# Patient Record
Sex: Female | Born: 1990 | Race: Black or African American | Hispanic: No | Marital: Single | Smoking: Current every day smoker
Health system: Southern US, Community
[De-identification: ages and names within clinical notes are randomized; demographics above are authoritative.]

## PROBLEM LIST (undated history)

## (undated) DIAGNOSIS — F32A Depression, unspecified: Secondary | ICD-10-CM

## (undated) DIAGNOSIS — F329 Major depressive disorder, single episode, unspecified: Secondary | ICD-10-CM

## (undated) DIAGNOSIS — Z789 Other specified health status: Secondary | ICD-10-CM

## (undated) DIAGNOSIS — D649 Anemia, unspecified: Secondary | ICD-10-CM

## (undated) HISTORY — DX: Anemia, unspecified: D64.9

## (undated) HISTORY — PX: HERNIA REPAIR: SHX51

---
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
## 2010-10-07 LAB — URINALYSIS, ROUTINE W REFLEX MICROSCOPIC
Leukocytes, UA: NEGATIVE
Nitrite: NEGATIVE
Specific Gravity, Urine: 1.008 (ref 1.005–1.030)
pH: 6 (ref 5.0–8.0)

## 2010-10-07 LAB — HEPATIC FUNCTION PANEL
Albumin: 4 g/dL (ref 3.5–5.2)
Bilirubin, Direct: <0.1 mg/dL (ref 0.0–0.3)
Total Bilirubin: 0.6 mg/dL (ref 0.3–1.2)

## 2010-10-07 LAB — URINE MICROSCOPIC-ADD ON

## 2010-10-07 LAB — GC/CHLAMYDIA PROBE AMP, URINE
Chlamydia, Swab/Urine, PCR: POSITIVE — AB
GC Probe Amp, Urine: NEGATIVE

## 2010-10-07 LAB — BASIC METABOLIC PANEL
BUN: 5 mg/dL — ABNORMAL LOW (ref 6–23)
Creatinine, Ser: 0.71 mg/dL (ref 0.4–1.2)
GFR calc non Af Amer: >60 mL/min (ref >60)

## 2010-10-07 LAB — CBC
MCV: 80.7 fL (ref 78.0–100.0)
Platelets: 401 10*3/uL — ABNORMAL HIGH (ref 150–400)
RDW: 14.5 % (ref 11.5–15.5)
WBC: 7.3 10*3/uL (ref 4.0–10.5)

## 2010-10-07 LAB — DIFFERENTIAL
Basophils Absolute: 0 10*3/uL (ref 0.0–0.1)
Eosinophils Absolute: 0.1 10*3/uL (ref 0.0–0.7)
Lymphocytes Relative: 36 % (ref 12–46)
Lymphs Abs: 2.6 10*3/uL (ref 0.7–4.0)
Neutrophils Relative %: 54 % (ref 43–77)

## 2010-10-07 LAB — POCT PREGNANCY, URINE: Preg Test, Ur: NEGATIVE

## 2010-10-07 LAB — RPR: RPR Ser Ql: NONREACTIVE

## 2010-10-07 LAB — GAMMA GT: GGT: 20 U/L (ref 7–51)

## 2010-10-07 LAB — RAPID URINE DRUG SCREEN, HOSP PERFORMED
Opiates: NOT DETECTED
Tetrahydrocannabinol: NOT DETECTED

## 2010-10-07 LAB — ETHANOL: Alcohol, Ethyl (B): <5 mg/dL (ref 0–10)

## 2010-10-11 LAB — POCT PREGNANCY, URINE: Preg Test, Ur: NEGATIVE

## 2010-10-11 LAB — POCT URINALYSIS DIP (DEVICE)
Glucose, UA: NEGATIVE mg/dL
Specific Gravity, Urine: 1.02 (ref 1.005–1.030)
Urobilinogen, UA: 2 mg/dL — ABNORMAL HIGH (ref 0.0–1.0)

## 2011-01-10 ENCOUNTER — Encounter (HOSPITAL_COMMUNITY): Payer: Self-pay | Admitting: Anesthesiology

## 2011-01-10 ENCOUNTER — Encounter (HOSPITAL_COMMUNITY): Payer: Self-pay

## 2011-01-10 ENCOUNTER — Inpatient Hospital Stay (HOSPITAL_COMMUNITY): Payer: Medicaid Other | Admitting: Anesthesiology

## 2011-01-10 ENCOUNTER — Encounter (HOSPITAL_COMMUNITY)
Admission: AD | Disposition: A | Discharge status: Home / Self Care | Payer: Self-pay | Source: Ambulatory Visit | Attending: Obstetrics and Gynecology

## 2011-01-10 ENCOUNTER — Other Ambulatory Visit: Payer: Self-pay | Admitting: Obstetrics and Gynecology

## 2011-01-10 ENCOUNTER — Inpatient Hospital Stay (HOSPITAL_COMMUNITY)
Admission: AD | Admit: 2011-01-10 | Discharge: 2011-01-13 | DRG: 766 | Disposition: A | Discharge status: Home / Self Care | Payer: Medicaid Other | Source: Ambulatory Visit | Attending: Obstetrics and Gynecology | Admitting: Obstetrics and Gynecology

## 2011-01-10 HISTORY — DX: Depression, unspecified: F32.A

## 2011-01-10 HISTORY — DX: Other specified health status: Z78.9

## 2011-01-10 HISTORY — DX: Major depressive disorder, single episode, unspecified: F32.9

## 2011-01-10 LAB — CBC
Hemoglobin: 12.8 g/dL (ref 12.0–15.0)
MCH: 28.6 pg (ref 26.0–34.0)
MCHC: 33.7 g/dL (ref 30.0–36.0)
MCV: 84.8 fL (ref 78.0–100.0)

## 2011-01-10 LAB — RPR
RPR Ser Ql: NONREACTIVE
RPR: NONREACTIVE

## 2011-01-10 LAB — ABO/RH: RH Type: POSITIVE

## 2011-01-10 SURGERY — Surgical Case
Anesthesia: Monitor Anesthesia Care | Site: Abdomen | Wound class: Clean Contaminated

## 2011-01-10 MED ORDER — RHO D IMMUNE GLOBULIN 1500 UNIT/2ML IJ SOLN: 300.0000 ug | Freq: Once | INTRAMUSCULAR | Status: DC

## 2011-01-10 MED ORDER — ONDANSETRON HCL 4 MG/2ML IJ SOLN: 4.0000 mg | INTRAMUSCULAR | Status: DC | PRN

## 2011-01-10 MED ORDER — OXYTOCIN 20 UNITS IN LACTATED RINGERS INFUSION - SIMPLE
125.0000 mL/h | INTRAVENOUS | Status: AC
  Administered 2011-01-10: 125 mL/h via INTRAVENOUS

## 2011-01-10 MED ORDER — EPHEDRINE 5 MG/ML INJ
10.0000 mg | INTRAVENOUS | Status: DC | PRN
  Filled 2011-01-10: qty 2

## 2011-01-10 MED ORDER — FENTANYL CITRATE 0.05 MG/ML IJ SOLN
INTRAMUSCULAR | Status: AC
  Filled 2011-01-10: qty 2

## 2011-01-10 MED ORDER — KETOROLAC TROMETHAMINE 60 MG/2ML IM SOLN
60.0000 mg | Freq: Once | INTRAMUSCULAR | Status: AC | PRN
  Administered 2011-01-10: 60 mg via INTRAMUSCULAR
  Filled 2011-01-10: qty 2

## 2011-01-10 MED ORDER — LIDOCAINE HCL 1.5 % IJ SOLN
INTRAMUSCULAR | Status: DC | PRN
  Administered 2011-01-10: 10 mL via INTRADERMAL

## 2011-01-10 MED ORDER — CITRIC ACID-SODIUM CITRATE 334-500 MG/5ML PO SOLN
30.0000 mL | ORAL | Status: DC | PRN
  Administered 2011-01-10: 30 mL via ORAL
  Filled 2011-01-10: qty 30
  Filled 2011-01-10: qty 15

## 2011-01-10 MED ORDER — PHENYLEPHRINE 40 MCG/ML (10ML) SYRINGE FOR IV PUSH (FOR BLOOD PRESSURE SUPPORT)
PREFILLED_SYRINGE | INTRAVENOUS | Status: AC
  Filled 2011-01-10: qty 10

## 2011-01-10 MED ORDER — CEFAZOLIN SODIUM 1-5 GM-% IV SOLN
INTRAVENOUS | Status: DC | PRN
  Administered 2011-01-10: 1 g via INTRAVENOUS

## 2011-01-10 MED ORDER — TETANUS-DIPHTH-ACELL PERTUSSIS 5-2.5-18.5 LF-MCG/0.5 IM SUSP
0.5000 mL | Freq: Once | INTRAMUSCULAR | Status: AC
  Administered 2011-01-11: 0.5 mL via INTRAMUSCULAR
  Filled 2011-01-10: qty 0.5

## 2011-01-10 MED ORDER — PROMETHAZINE HCL 25 MG/ML IJ SOLN
6.2500 mg | INTRAMUSCULAR | Status: DC | PRN
Start: 2011-01-10 — End: 2011-01-10

## 2011-01-10 MED ORDER — FENTANYL CITRATE 0.05 MG/ML IJ SOLN
INTRAMUSCULAR | Status: DC | PRN
  Administered 2011-01-10: 100 ug via INTRAVENOUS

## 2011-01-10 MED ORDER — WITCH HAZEL-GLYCERIN EX PADS: MEDICATED_PAD | CUTANEOUS | Status: DC | PRN

## 2011-01-10 MED ORDER — PRENATAL PLUS 27-1 MG PO TABS
1.0000 | ORAL_TABLET | Freq: Every day | ORAL | Status: DC
  Administered 2011-01-11 – 2011-01-13 (×3): 1 via ORAL
  Filled 2011-01-10 (×3): qty 1

## 2011-01-10 MED ORDER — ACETAMINOPHEN 325 MG PO TABS: 650.0000 mg | ORAL_TABLET | ORAL | Status: DC | PRN

## 2011-01-10 MED ORDER — IBUPROFEN 600 MG PO TABS: 600.0000 mg | ORAL_TABLET | Freq: Four times a day (QID) | ORAL | Status: DC | PRN

## 2011-01-10 MED ORDER — ACETAMINOPHEN 10 MG/ML IV SOLN
1000.0000 mg | Freq: Once | INTRAVENOUS | Status: DC | PRN
  Filled 2011-01-10: qty 100

## 2011-01-10 MED ORDER — LACTATED RINGERS IV SOLN: 500.0000 mL | Freq: Once | INTRAVENOUS | Status: DC | PRN

## 2011-01-10 MED ORDER — SIMETHICONE 80 MG PO CHEW
80.0000 mg | CHEWABLE_TABLET | Freq: Three times a day (TID) | ORAL | Status: DC
  Administered 2011-01-11 – 2011-01-13 (×10): 80 mg via ORAL

## 2011-01-10 MED ORDER — SODIUM BICARBONATE 8.4 % IV SOLN
INTRAVENOUS | Status: AC
  Filled 2011-01-10: qty 50

## 2011-01-10 MED ORDER — MORPHINE SULFATE 0.5 MG/ML IJ SOLN
INTRAMUSCULAR | Status: AC
  Filled 2011-01-10: qty 10

## 2011-01-10 MED ORDER — PHENYLEPHRINE HCL 10 MG/ML IJ SOLN
INTRAMUSCULAR | Status: DC | PRN
  Administered 2011-01-10 (×2): 80 ug via INTRAVENOUS

## 2011-01-10 MED ORDER — FENTANYL 2.5 MCG/ML BUPIVACAINE 1/10 % EPIDURAL INFUSION (WH - ANES)
INTRAMUSCULAR | Status: DC | PRN
  Administered 2011-01-10: 14 mL/h via EPIDURAL

## 2011-01-10 MED ORDER — ONDANSETRON HCL 4 MG/2ML IJ SOLN
INTRAMUSCULAR | Status: AC
  Filled 2011-01-10: qty 2

## 2011-01-10 MED ORDER — LACTATED RINGERS IV SOLN
INTRAVENOUS | Status: DC | PRN
  Administered 2011-01-10: 19:00:00 via INTRAVENOUS

## 2011-01-10 MED ORDER — METOCLOPRAMIDE HCL 5 MG/ML IJ SOLN: 10.0000 mg | Freq: Three times a day (TID) | INTRAMUSCULAR | Status: DC | PRN

## 2011-01-10 MED ORDER — PHENYLEPHRINE 40 MCG/ML (10ML) SYRINGE FOR IV PUSH (FOR BLOOD PRESSURE SUPPORT)
80.0000 ug | PREFILLED_SYRINGE | INTRAVENOUS | Status: DC | PRN
  Filled 2011-01-10: qty 2

## 2011-01-10 MED ORDER — ONDANSETRON HCL 4 MG PO TABS: 4.0000 mg | ORAL_TABLET | ORAL | Status: DC | PRN

## 2011-01-10 MED ORDER — DIPHENHYDRAMINE HCL 50 MG/ML IJ SOLN
12.5000 mg | INTRAMUSCULAR | Status: DC | PRN
  Administered 2011-01-11 (×2): 50 mg via INTRAVENOUS
  Filled 2011-01-10: qty 1

## 2011-01-10 MED ORDER — OXYTOCIN 20 UNITS IN LACTATED RINGERS INFUSION - SIMPLE
INTRAVENOUS | Status: DC | PRN
  Administered 2011-01-10: 20 [IU] via INTRAVENOUS

## 2011-01-10 MED ORDER — IBUPROFEN 600 MG PO TABS
600.0000 mg | ORAL_TABLET | Freq: Four times a day (QID) | ORAL | Status: DC
  Administered 2011-01-11 – 2011-01-13 (×8): 600 mg via ORAL
  Filled 2011-01-10 (×8): qty 1

## 2011-01-10 MED ORDER — DIPHENHYDRAMINE HCL 50 MG/ML IJ SOLN
12.5000 mg | INTRAMUSCULAR | Status: DC | PRN
  Administered 2011-01-10: 12.5 mg via INTRAVENOUS
  Filled 2011-01-10: qty 1

## 2011-01-10 MED ORDER — CEFAZOLIN SODIUM 1-5 GM-% IV SOLN
INTRAVENOUS | Status: AC
  Filled 2011-01-10: qty 50

## 2011-01-10 MED ORDER — OXYCODONE-ACETAMINOPHEN 5-325 MG PO TABS
1.0000 | ORAL_TABLET | ORAL | Status: DC | PRN
  Administered 2011-01-11: 2 via ORAL
  Administered 2011-01-12: 1 via ORAL
  Administered 2011-01-12 – 2011-01-13 (×3): 2 via ORAL
  Filled 2011-01-10: qty 1
  Filled 2011-01-10 (×2): qty 2
  Filled 2011-01-10: qty 1
  Filled 2011-01-10: qty 2
  Filled 2011-01-10: qty 1

## 2011-01-10 MED ORDER — FLEET ENEMA 7-19 GM/118ML RE ENEM: 1.0000 | ENEMA | RECTAL | Status: DC | PRN

## 2011-01-10 MED ORDER — PHENYLEPHRINE 40 MCG/ML (10ML) SYRINGE FOR IV PUSH (FOR BLOOD PRESSURE SUPPORT)
80.0000 ug | PREFILLED_SYRINGE | INTRAVENOUS | Status: DC | PRN
  Filled 2011-01-10: qty 5
  Filled 2011-01-10: qty 2

## 2011-01-10 MED ORDER — LACTATED RINGERS IV SOLN
INTRAVENOUS | Status: DC
  Administered 2011-01-10 (×2): via INTRAVENOUS

## 2011-01-10 MED ORDER — ONDANSETRON HCL 4 MG/2ML IJ SOLN
INTRAMUSCULAR | Status: DC | PRN
  Administered 2011-01-10: 4 mg via INTRAVENOUS

## 2011-01-10 MED ORDER — NALOXONE HCL 0.4 MG/ML IJ SOLN: 0.4000 mg | INTRAMUSCULAR | Status: DC | PRN

## 2011-01-10 MED ORDER — FENTANYL 2.5 MCG/ML BUPIVACAINE 1/10 % EPIDURAL INFUSION (WH - ANES)
2.0000 mL/h | INTRAMUSCULAR | Status: DC
  Filled 2011-01-10 (×2): qty 60

## 2011-01-10 MED ORDER — SODIUM CHLORIDE 0.9 % IJ SOLN: 3.0000 mL | INTRAMUSCULAR | Status: DC | PRN

## 2011-01-10 MED ORDER — MENTHOL 3 MG MT LOZG: 1.0000 | LOZENGE | OROMUCOSAL | Status: DC | PRN

## 2011-01-10 MED ORDER — NALBUPHINE HCL 10 MG/ML IJ SOLN
5.0000 mg | INTRAMUSCULAR | Status: AC | PRN
  Administered 2011-01-10: 5 mg via INTRAVENOUS
  Filled 2011-01-10 (×2): qty 1

## 2011-01-10 MED ORDER — SENNOSIDES-DOCUSATE SODIUM 8.6-50 MG PO TABS
1.0000 | ORAL_TABLET | Freq: Every day | ORAL | Status: DC
  Administered 2011-01-10 – 2011-01-11 (×2): 1 via ORAL
  Administered 2011-01-12: 2 via ORAL
  Filled 2011-01-10 (×3): qty 2

## 2011-01-10 MED ORDER — LACTATED RINGERS IV SOLN
500.0000 mL | Freq: Once | INTRAVENOUS | Status: AC
  Administered 2011-01-10: 1000 mL via INTRAVENOUS

## 2011-01-10 MED ORDER — KETOROLAC TROMETHAMINE 30 MG/ML IJ SOLN: 30.0000 mg | Freq: Four times a day (QID) | INTRAMUSCULAR | Status: AC | PRN

## 2011-01-10 MED ORDER — SCOPOLAMINE 1 MG/3DAYS TD PT72
MEDICATED_PATCH | TRANSDERMAL | Status: AC
  Administered 2011-01-10: 1.5 mg via TRANSDERMAL
  Filled 2011-01-10: qty 1

## 2011-01-10 MED ORDER — ONDANSETRON HCL 4 MG/2ML IJ SOLN: 4.0000 mg | Freq: Three times a day (TID) | INTRAMUSCULAR | Status: DC | PRN

## 2011-01-10 MED ORDER — EPHEDRINE 5 MG/ML INJ
10.0000 mg | INTRAVENOUS | Status: DC | PRN
  Filled 2011-01-10: qty 4
  Filled 2011-01-10: qty 2

## 2011-01-10 MED ORDER — DIPHENHYDRAMINE HCL 25 MG PO CAPS
25.0000 mg | ORAL_CAPSULE | ORAL | Status: DC | PRN
  Administered 2011-01-11 – 2011-01-12 (×2): 25 mg via ORAL
  Filled 2011-01-10 (×3): qty 1

## 2011-01-10 MED ORDER — DIPHENHYDRAMINE HCL 50 MG/ML IJ SOLN: 25.0000 mg | INTRAMUSCULAR | Status: DC | PRN

## 2011-01-10 MED ORDER — LIDOCAINE-EPINEPHRINE (PF) 2 %-1:200000 IJ SOLN
INTRAMUSCULAR | Status: AC
  Filled 2011-01-10: qty 20

## 2011-01-10 MED ORDER — TERBUTALINE SULFATE 1 MG/ML IJ SOLN: 0.2500 mg | Freq: Once | INTRAMUSCULAR | Status: DC | PRN

## 2011-01-10 MED ORDER — MEPERIDINE HCL 25 MG/ML IJ SOLN: 6.2500 mg | INTRAMUSCULAR | Status: DC | PRN

## 2011-01-10 MED ORDER — OXYTOCIN 10 UNIT/ML IJ SOLN
INTRAMUSCULAR | Status: AC
  Filled 2011-01-10: qty 4

## 2011-01-10 MED ORDER — SODIUM CHLORIDE 0.9 % IV SOLN
1.0000 ug/kg/h | INTRAVENOUS | Status: DC | PRN
  Filled 2011-01-10: qty 2.5

## 2011-01-10 MED ORDER — ACETAMINOPHEN 325 MG PO TABS: 325.0000 mg | ORAL_TABLET | ORAL | Status: DC | PRN

## 2011-01-10 MED ORDER — MORPHINE SULFATE 10 MG/ML IJ SOLN
INTRAMUSCULAR | Status: DC | PRN
  Administered 2011-01-10: 1 mg via INTRAVENOUS

## 2011-01-10 MED ORDER — ZOLPIDEM TARTRATE 5 MG PO TABS: 5.0000 mg | ORAL_TABLET | Freq: Every evening | ORAL | Status: DC | PRN

## 2011-01-10 MED ORDER — SIMETHICONE 80 MG PO CHEW
80.0000 mg | CHEWABLE_TABLET | ORAL | Status: DC | PRN
  Administered 2011-01-10 – 2011-01-12 (×2): 80 mg via ORAL

## 2011-01-10 MED ORDER — SCOPOLAMINE 1 MG/3DAYS TD PT72
1.0000 | MEDICATED_PATCH | Freq: Once | TRANSDERMAL | Status: DC
  Administered 2011-01-10: 1.5 mg via TRANSDERMAL
  Filled 2011-01-10: qty 1

## 2011-01-10 MED ORDER — KETOROLAC TROMETHAMINE 30 MG/ML IJ SOLN
30.0000 mg | Freq: Four times a day (QID) | INTRAMUSCULAR | Status: AC | PRN
  Administered 2011-01-11: 30 mg via INTRAVENOUS
  Filled 2011-01-10: qty 1

## 2011-01-10 MED ORDER — ONDANSETRON HCL 4 MG/2ML IJ SOLN: 4.0000 mg | Freq: Four times a day (QID) | INTRAMUSCULAR | Status: DC | PRN

## 2011-01-10 MED ORDER — MORPHINE SULFATE (PF) 0.5 MG/ML IJ SOLN
INTRAMUSCULAR | Status: DC | PRN
  Administered 2011-01-10: 4 mg via EPIDURAL

## 2011-01-10 MED ORDER — KETOROLAC TROMETHAMINE 60 MG/2ML IM SOLN
INTRAMUSCULAR | Status: AC
  Administered 2011-01-10: 60 mg via INTRAMUSCULAR
  Filled 2011-01-10: qty 2

## 2011-01-10 MED ORDER — OXYTOCIN 20 UNITS IN LACTATED RINGERS INFUSION - SIMPLE
1.0000 m[IU]/min | INTRAVENOUS | Status: DC
  Administered 2011-01-10: 2 m[IU]/min via INTRAVENOUS

## 2011-01-10 MED ORDER — OXYTOCIN 20 UNITS IN LACTATED RINGERS INFUSION - SIMPLE
125.0000 mL/h | Freq: Once | INTRAVENOUS | Status: DC
  Filled 2011-01-10: qty 1000

## 2011-01-10 MED ORDER — NALBUPHINE HCL 10 MG/ML IJ SOLN
5.0000 mg | INTRAMUSCULAR | Status: AC | PRN
  Administered 2011-01-11: 5 mg via SUBCUTANEOUS
  Filled 2011-01-10: qty 1

## 2011-01-10 MED ORDER — DIPHENHYDRAMINE HCL 25 MG PO CAPS
25.0000 mg | ORAL_CAPSULE | Freq: Four times a day (QID) | ORAL | Status: DC | PRN
  Administered 2011-01-11 (×2): 25 mg via ORAL

## 2011-01-10 MED ORDER — LIDOCAINE-EPINEPHRINE (PF) 1.5 %-1:200000 IJ SOLN
INTRAMUSCULAR | Status: DC | PRN
  Administered 2011-01-10: 15 mL

## 2011-01-10 MED ORDER — HYDROMORPHONE HCL 1 MG/ML IJ SOLN: 0.2500 mg | INTRAMUSCULAR | Status: DC | PRN

## 2011-01-10 MED ORDER — LIDOCAINE HCL (PF) 1 % IJ SOLN
30.0000 mL | Freq: Once | INTRAMUSCULAR | Status: DC | PRN
  Filled 2011-01-10: qty 30

## 2011-01-10 MED ORDER — NALBUPHINE SYRINGE 5 MG/0.5 ML
10.0000 mg | INJECTION | INTRAMUSCULAR | Status: DC | PRN
  Filled 2011-01-10: qty 1

## 2011-01-10 SURGICAL SUPPLY — 23 items
CLOTH BEACON ORANGE TIMEOUT ST (SAFETY) ×2 IMPLANT
ELECT REM PT RETURN 9FT ADLT (ELECTROSURGICAL) ×2
ELECTRODE REM PT RTRN 9FT ADLT (ELECTROSURGICAL) ×1 IMPLANT
GLOVE BIO SURGEON STRL SZ 6.5 (GLOVE) ×2 IMPLANT
GLOVE BIO SURGEON STRL SZ7 (GLOVE) ×2 IMPLANT
GLOVE BIOGEL PI IND STRL 7.0 (GLOVE) ×1 IMPLANT
GLOVE BIOGEL PI INDICATOR 7.0 (GLOVE) ×1
GLOVE ECLIPSE 7.0 STRL STRAW (GLOVE) ×2 IMPLANT
GLOVE INDICATOR 7.0 STRL GRN (GLOVE) ×2 IMPLANT
GOWN BRE IMP SLV AUR LG STRL (GOWN DISPOSABLE) ×6 IMPLANT
KIT ABG SYR 3ML LUER SLIP (SYRINGE) ×2 IMPLANT
NEEDLE HYPO 25X5/8 SAFETYGLIDE (NEEDLE) ×2 IMPLANT
NS IRRIG 1000ML POUR BTL (IV SOLUTION) ×2 IMPLANT
PACK C SECTION WH (CUSTOM PROCEDURE TRAY) ×2 IMPLANT
RTRCTR C-SECT PINK 25CM LRG (MISCELLANEOUS) ×2 IMPLANT
SLEEVE SCD COMPRESS KNEE MED (MISCELLANEOUS) ×2 IMPLANT
STAPLER VISISTAT 35W (STAPLE) ×2 IMPLANT
SUT CHROMIC 0 CTX 36 (SUTURE) ×4 IMPLANT
SUT VIC AB 0 CTXB 36 (SUTURE) ×4 IMPLANT
SUT VIC AB 2-0 CT1 27 (SUTURE) ×1
SUT VIC AB 2-0 CT1 TAPERPNT 27 (SUTURE) ×1 IMPLANT
TOWEL OR 17X24 6PK STRL BLUE (TOWEL DISPOSABLE) ×4 IMPLANT
WATER STERILE IRR 1000ML POUR (IV SOLUTION) ×2 IMPLANT

## 2011-01-10 NOTE — Consult Note (Signed)
Neonatology Note:   I was asked to attend this Stat C/S at term. The mother is a G1P0 AB pos, GBS neg with. She was having FHR decels for about 1 hour PTD, which worsened upon ROM. Meconium-stained fluid was noted and a Stat c/s was called. At delivery, infant vigorous with good spontaneous cry and tone. Needed only minimal bulb suctioning. Ap 9/9. Lungs clear to ausc in DR. To CN at 1932 to care of Pediatrician. Mellody Memos, MD

## 2011-01-10 NOTE — Progress Notes (Signed)
Pt crying and upset, having anxiety about getting epidural

## 2011-01-10 NOTE — ED Notes (Signed)
Pt with cervical change, GBS negative, FHT reactive and reassuring, order to admit to [facility]. Routine orders, Pt may have epidural when needed

## 2011-01-10 NOTE — Anesthesia Procedure Notes (Addendum)
Epidural Patient location during procedure: OB Start time: 01/10/2011 1:50 PM Reason for block: procedure for pain  Staffing Performed by: anesthesiologist   Preanesthetic Checklist Completed: patient identified, site marked, surgical consent, pre-op evaluation, timeout performed, IV checked, risks and benefits discussed and monitors and equipment checked  Epidural Patient position: sitting Prep: DuraPrep Patient monitoring: continuous pulse ox Approach: midline Injection technique: LOR saline  Needle Needle type: Tuohy  Needle gauge: 17 G Needle length: 9 cm Catheter type: closed end flexible Catheter size: 19 Gauge Test dose: negative  Assessment Events: blood not aspirated, injection not painful, no injection resistance, negative IV test and no paresthesia  Additional Notes Patient identified.  Risk benefits discussed including failed block, incomplete pain control, headache, nerve damage, paralysis, blood pressure changes, reactions to medication both toxic or allergic, and postpartum back pain.  Patient expressed understanding and wished to proceed.  All questions were answered.  Sterile technique used throughout procedure and epidural site dressed with sterile barrier dressing.  Please see nursing notes for vital signs.   Performed by: Jiles Garter

## 2011-01-10 NOTE — Transfer of Care (Signed)
  Anesthesia Post-op Note  Patient: Brittany Pierce  Procedure(s) Performed:  CESAREAN SECTION  No anesthesia complications.  Level of consciousness: alert. Cardiopulmonary status stable.  No follow-up care or observation required.  Monick Rena L. Rodman Pickle, MD

## 2011-01-10 NOTE — Anesthesia Postprocedure Evaluation (Signed)
Immediate Anesthesia Transfer of Care Note  Patient: Brittany Pierce  Procedure(s) Performed:  CESAREAN SECTION  Patient Location: PACU  Anesthesia Type: Epidural  Level of Consciousness: awake, oriented and patient cooperative  Airway & Oxygen Therapy: Patient Spontanous Breathing  Post-op Assessment: Report given to PACU RN and Post -op Vital signs reviewed and stable  Post vital signs: stable  Complications: No apparent anesthesia complications

## 2011-01-10 NOTE — Progress Notes (Signed)
Contractions since this morning, no bleeding, no LOF

## 2011-01-10 NOTE — Anesthesia Preprocedure Evaluation (Signed)
Anesthesia Evaluation  Name, MR# and DOB Patient awake  General Assessment Comment  Reviewed: Allergy & Precautions, H&P  and Patient's Chart, lab work & pertinent test results  History of Anesthesia Complications Negative for: history of anesthetic complications  Airway Mallampati: IV TM Distance: >3 FB Neck ROM: full    Dental No notable dental hx    Pulmonaryneg pulmonary ROS      pulmonary exam normal obese  Cardiovascular Exercise Tolerance: Good regular Normal   Neuro/PsychNegative Neurological ROS Negative Psych ROS  GI/Hepatic/Renal negative GI ROS, negative Liver ROS, and negative Renal ROS (+)       Endo/Other  Negative Endocrine ROS (+)   Abdominal   Musculoskeletal  Hematology negative hematology ROS (+)   Peds  Reproductive/Obstetrics negative OB ROS          Anesthesia Physical Anesthesia Plan  ASA: II  Anesthesia Plan: Epidural   Post-op Pain Management:    Induction:   Airway Management Planned:   Additional Equipment:   Intra-op Plan:   Post-operative Plan:   Informed Consent: I have reviewed the patients History and Physical, chart, labs and discussed the procedure including the risks, benefits and alternatives for the proposed anesthesia with the patient or authorized representative who has indicated his/her understanding and acceptance.     Plan Discussed with:   Anesthesia Plan Comments:         Anesthesia Quick Evaluation

## 2011-01-10 NOTE — Transfer of Care (Signed)
  Anesthesia Post-op Note  Patient: Brittany Pierce  Procedure(s) Performed:  CESAREAN SECTION  Patient Location: PACU  Anesthesia Type: Epidural  Level of Consciousness: awake, alert , oriented and patient cooperative  Airway and Oxygen Therapy: Patient Spontanous Breathing  Post-op Pain: none  Post-op Assessment: Post-op Vital signs reviewed, Patient's Cardiovascular Status Stable and Respiratory Function Stable  Post-op Vital Signs: stable  Complications: No apparent anesthesia complications

## 2011-01-10 NOTE — Treatment Plan (Signed)
Pt with varible decelerations. Spoke with dr Gevena Cotton. Pt to be monitored for 1-2 hours, recheck cervix and d/c if no change and reactive FHT

## 2011-01-10 NOTE — Progress Notes (Signed)
Brittany Pierce is a 20 y.o. G1P1001 at [redacted]w[redacted]d by LMP admitted for active labor  Subjective:   Objective: BP 109/56  Pulse 75  Temp(Src) 98.2 F (36.8 C) (Oral)  Resp 18  Ht 5\' 4"  (1.626 m)  Wt 82.645 kg (182 lb 3.2 oz)  BMI 31.27 kg/m2  SpO2 100%  Breastfeeding? Unknown   I/O this shift: In: 800 [I.V.:800] Out: 2100 [Urine:1400; Blood:700]  FHT:  FHR: 120 bpm, variability: moderate,  accelerations:  Abscent,  decelerations:  Present with repetitive variable decelerations UC:   irregular, every 3-6 with couplets minutes SVE:   Dilation: 8 Effacement (%): 100 Station: -1 Exam by:: [name]  Labs: Lab Results  Component Value Date   WBC 9.1 01/10/2011   HGB 12.8 01/10/2011   HCT 38.0 01/10/2011   MCV 84.8 01/10/2011   PLT 219 01/10/2011    Assessment / Plan: Nonreassuring fetal heart tracing Artificial rupture of membranes done with moderate meconium noted. Fetal scalp electrode and IUPC placed for internal monitoring.  Attempted amnioinfusion to resolve repetitive variable decelerations. Change in position, IV fluids, stopping the pitocin did not help. Kept patient and her partner updated throughout the process. Offered option of cesarean section explaining  Indication, risks, benefits. She gave her informed consent and we proceeded to do a cesarean section.  Brittany Pierce 01/10/2011, 7:54 PM

## 2011-01-10 NOTE — Op Note (Signed)
Pre-operative diagnosis: nonreassuring fetal heart tracing  Postoperative diagnosis: same  Procedure performed: primary low transverse cesarean section  Findings: Full term live female child delivered at 1921 Apgars were 9 and 9. Cord pH 7.29.  Urine output 1400 mL clear yellow urine.  Estimated blood loss 700 mL.  IV fluids 400 mL  The patient is a 20 year old gravida 1 para 0 at [redacted] weeks gestation who was admitted in labor she was initially 2 cm dilated and 60% a phase -2 station. She progressed to 4 cm dilation and her contractions started to space out. She was then started on Pitocin for augmentation. The fetal heart tracing initially was stable then she developed variable decelerations the Pitocin was discontinued we tried several maneuvers including oxygen, IV fluid boluses, change in position, and amnioinfusion. Her artificial rupture of membranes revealed moderate meconium. Her heart decelerations persisted and became more frequent therefore a diagnosis of nonreassuring fetal heart tracing was made. informed consent was obtained from the patient she delivered a baby via cesarean section.her epidural which had been in place during the labor process was bolused accordingly she was taken to the operating room stat. She was prepped and draped in sterile manner. A Pfannenstiel incision was made 2 cm above the pubic symphysis with a scalpel. The incision was carried down to the fascia with the scalpel and Mayo scissors was used to incise the fascia bilaterally in a curvilinear fashion. 2 kocher clamps were used to grasp the superior fascial flap on either side of the midline. The fascia was tented upwards and the underlying rectus muscle was sharply dissected off with the Mayo scissors.the same was done in inferiorly down to the pubic bone. The rectus muscle was then separated in the midline and the peritoneum was entered by blunt dissection a bladder flap was created with the Metzenbaum scissors and  developed digitally. A ALexis O. Retractor was placed. The uterus was incised with the scalpel. The infant's head was delivered atraumatically and the rest of the body with fundal pressure there. There was a nuchal cord that was reduced upon delivery. The infant's nose and mouth were suctioned. The cord was clamped and cut. The baby was handed off to the waiting pediatricians. Cord blood was obtained as well as a sample for cord gas. The baby cried spontaneously on the operative field even before he was handed off to the pediatricians. The uterus was then massaged as the placenta was delivered. The uterus was cleared of all clots and debris using a dry laparotomy sponge. The bleeding edges of the uterus were grasped with clamps and a single layer uterine closure was done using 0 chromic in a running locked fashion. The closure was found to be hemostatic. The field was inspected and we had excellent hemostasis and then we began our closure. The fascia was closed in a running continuous fashion with 0 Vicryl and the skin was closed with staples. The sponge, needle, and instrument counts were correct x2 the patient tolerated the procedure well. She was transferred to the PACU in stable condition.

## 2011-01-10 NOTE — Progress Notes (Deleted)
Applied and assessing

## 2011-01-10 NOTE — H&P (Signed)
Brittany Pierce is a 20 y.o. female presenting for painful contractions. She was found to be 1cm dilated in the office on Friday, then progressed to 2cm with regular contractions after a period of observation in the MAU. Maternal Medical History:  Reason for admission: Reason for admission: contractions.  Contractions: Onset was 3-5 hours ago.   Frequency: irregular.   Perceived severity is moderate.    Fetal activity: Perceived fetal activity is normal.      OB History    Grav Para Term Preterm Abortions TAB SAB Ect Mult Living   1 1 1  0 0 0 0 0 0 1     Past Medical History  Diagnosis Date  . No pertinent past medical history   . Depression     per prenatal   Past Surgical History  Procedure Date  . Hernia repair    Family History: family history is negative for Anesthesia problems, and Hypotension, and Malignant hyperthermia, and Pseudochol deficiency, . Social History:  reports that she quit smoking about 7 months ago. Her smoking use included Cigarettes. She has never used smokeless tobacco. She reports that she does not drink alcohol or use illicit drugs.  Review of Systems  Constitutional: Negative.   HENT: Negative.   Eyes: Negative.   Respiratory: Negative.   Cardiovascular: Negative.   Gastrointestinal: Negative.   Genitourinary: Negative.   Musculoskeletal: Negative.   Skin: Negative.   Neurological: Negative.   Endo/Heme/Allergies: Negative.   Psychiatric/Behavioral: Negative.     Dilation: 8 Effacement (%): 100 Station: -1 Exam by:: Tasheka Houseman Blood pressure 109/56, pulse 75, temperature 98.2 F (36.8 C), temperature source Oral, resp. rate 18, height 5\' 4"  (1.626 m), weight 82.645 kg (182 lb 3.2 oz), SpO2 100.00%, unknown if currently breastfeeding. Maternal Exam:  Uterine Assessment: Contraction strength is moderate.  Abdomen: Patient reports no abdominal tenderness. Introitus: Normal vulva.   Physical Exam  Constitutional: She is oriented to person,  place, and time. She appears well-developed.  HENT:  Head: Normocephalic.  Neck: Normal range of motion.  Cardiovascular: Normal rate.   Respiratory: Effort normal and breath sounds normal.  GI: Soft.       Palpable contractions  Musculoskeletal: Normal range of motion.  Neurological: She is alert and oriented to person, place, and time.  Skin: Skin is warm.  Psychiatric: She has a normal mood and affect.    Prenatal labs:       RPR: NON REACTIVE (07/08 1128)  HBsAg:    HIV: Non-reactive (07/08 0854)  GBS: Negative (07/08 0853)   Assessment/Plan: 40 week IUP in active labor. FHT stable. Augmentation with pitocin per protocol Epidural for pain management.  Gorge Almanza 01/10/2011, 7:50 PM

## 2011-01-11 NOTE — Progress Notes (Signed)
UR chart review completed. Mical Brun RNC BSN 

## 2011-01-11 NOTE — Progress Notes (Signed)
Suggested by RN to enter room to assist with latch.  However, baby sleeping deeply (suck exam done: baby did not want to suck.  Tongue noted to lateralize well).  Mom taught hand expression and given phone # to call [facility] for later assist. [facility].

## 2011-01-11 NOTE — Progress Notes (Signed)
Repeated attempts to latch, infant and mom very sleepy, observed 12-15 minutes, mom describes good strong tugs at breast without pain. Wide flanged mouth with intermittent swallowing observed.

## 2011-01-12 NOTE — Progress Notes (Signed)
Baby sucks on tongue, suck training reviewed with mom. Mom was able to latch baby on right breast without assist. Advised mom baby needs to breastfeed every 2-3 hours or on demand for 10-20 minutes each breast. Awakening techniques reviewed.

## 2011-01-12 NOTE — Progress Notes (Signed)
Subjective: Postpartum Day 1: Cesarean Delivery Patient reports tolerating PO.    Objective: Vital signs in last 24 hours: Temp:  [97.7 F (36.5 C)-98.3 F (36.8 C)] 97.7 F (36.5 C) (07/10 0635) Pulse Rate:  [76-109] 76  (07/10 0635) Resp:  [18-20] 18  (07/10 0635) BP: (104-147)/(68-77) 110/69 mmHg (07/10 0635) SpO2:  [98 %-99 %] 99 % (07/09 1840)  Physical Exam:  General: alert and no distress Lochia: appropriate Uterine Fundus: firm Incision: healing well DVT Evaluation: No evidence of DVT seen on physical exam.   Basename 01/10/11 1128  HGB 12.8  HCT 38.0    Assessment/Plan: Status post Cesarean section. Doing well postoperatively.  Continue current care.  Randall Colden E 01/12/2011, 11:16 AM

## 2011-01-12 NOTE — Progress Notes (Signed)
Baby not nursing every 3 hours, according to chart baby did not nurse for greater that 6 hours last night, nursed at 0515 and had not nursed again till 1245. Currently at 5% weight loss, Advised mom importance of breastfeeding every 3 hours, mom has been offering breast, baby sleepy. Awakening techniques reviewed. Will have RN set up DEBP to encourage milk production and use any EBM available as supplement for baby.

## 2011-01-13 NOTE — Progress Notes (Signed)
ASSISTED WITH LATCH BUT BABY SLIPPING OFF NIPPLE.  #20MM NIPPLE SHIELD USED WITH IMPROVEMENT SUSTAINING LATCH.  GOOD RHYTHMIC SUCK-SWALLOW OBSERVED.  INSTRUCTED MOM TO PUMP X 15 MIN. AFTER FEED AND PC WITH 15-30 MLS. EBM.  PATIENTS BREASTS FULL, INSTRUCTED TO PUT BRA AND SHELLS ON.

## 2011-01-13 NOTE — Progress Notes (Signed)
BABY IS NOT LATCHING WELL.  ATTEMPTED ASSIST THIS AM AT 1100 BUT BABY TOO SLEEPY.  BABY SLIGHTLY JAUNDICED.  MOM PUMPED 23 CC'S AND ASSISTED HER WITH BOTTLEFEEDING BABY.  LC TO FOLLOW UP AT NEXT FEEDING.

## 2011-01-13 NOTE — Progress Notes (Signed)
Post Partum Day2 Subjective: no complaints  Objective: Blood pressure 108/70, pulse 82, temperature 98.3 F (36.8 C), temperature source Oral, resp. rate 20, height 5\' 4"  (1.626 m), weight 82.645 kg (182 lb 3.2 oz), SpO2 99.00%, unknown if currently breastfeeding.  Physical Exam:  General: alert and no distress Lochia: appropriate Uterine Fundus: firm Incision: healing well DVT Evaluation: No evidence of DVT seen on physical exam.  No results found for this basename: HGB:2,HCT:2 in the last 72 hours  Assessment/Plan: Plan for discharge tomorrow   LOS: 3 days   GREENE,ELEANOR E 01/13/2011, 2:06 PM

## 2011-01-13 NOTE — Progress Notes (Signed)
Post Partum Day3 Subjective: no complaints  Objective: Blood pressure 108/70, pulse 82, temperature 98.3 F (36.8 C), temperature source Oral, resp. rate 20, height 5\' 4"  (1.626 m), weight 82.645 kg (182 lb 3.2 oz), SpO2 99.00%, unknown if currently breastfeeding.  Physical Exam:  General: alert and no distress Lochia: appropriate Uterine Fundus: firm Incision: healing well DVT Evaluation: No evidence of DVT seen on physical exam.  No results found for this basename: HGB:2,HCT:2 in the last 72 hours  Assessment/Plan: Discharge home   LOS: 3 days   Brittany Pierce E 01/13/2011, 2:08 PM   3

## 2011-01-13 NOTE — Progress Notes (Signed)
Post Partum Day3 Subjective: no complaints  Objective: Blood pressure 127/78, pulse 92, temperature 97.9 F (36.6 C), temperature source Oral, resp. rate 19, height 5\' 4"  (1.626 m), weight 82.645 kg (182 lb 3.2 oz), SpO2 99.00%, unknown if currently breastfeeding.  Physical Exam:  General: alert and no distress Lochia: appropriate Uterine Fundus: firm Incision: healing well DVT Evaluation: No evidence of DVT seen on physical exam.  No results found for this basename: HGB:2,HCT:2 in the last 72 hours  Assessment/Plan: Discharge home   LOS: 3 days   GREENE,ELEANOR E 01/13/2011, 2:36 PM

## 2011-01-13 NOTE — Progress Notes (Signed)
LC ATTEMPTED TO ASSIST WITH 3 HOUR FEED BUT MOM HAD JUST PUMPED AND BOTTLEFED 32 CC OF EBM.  INSTRUCTED HER TO CALL FOR ASSIST WHEN BABY DESIRES TO EAT NEXT.

## 2011-01-15 NOTE — Discharge Summary (Signed)
Obstetric Discharge Summary Reason for Admission: onset of labor Prenatal Procedures: none Intrapartum Procedures: spontaneous vaginal delivery Postpartum Procedures: none Complications-Operative and Postpartum: none  Hemoglobin  Date Value Range Status  01/10/2011 12.8  12.0-15.0 (g/dL) Final     HCT  Date Value Range Status  01/10/2011 38.0  36.0-46.0 (%) Final    Discharge Diagnoses: Term Pregnancy-delivered  Discharge Information: Date: 01/15/2011 Activity: unrestricted Diet: routine  Condition: stable Instructions: refer to practice specific booklet Discharge to: home   Newborn Data: Live born  Information for the patient's newborn:  Lynelle Doctor [[id number]]  female ; APGAR , ; weight ;  Home with mother.  Brittany Pierce 01/15/2011, 12:09 PM

## 2011-02-23 ENCOUNTER — Other Ambulatory Visit: Payer: Self-pay | Admitting: Obstetrics and Gynecology

## 2011-02-23 ENCOUNTER — Other Ambulatory Visit (HOSPITAL_COMMUNITY)
Admission: RE | Admit: 2011-02-23 | Discharge: 2011-02-23 | Disposition: A | Discharge status: Home / Self Care | Payer: Medicaid Other | Source: Ambulatory Visit | Attending: Obstetrics and Gynecology | Admitting: Obstetrics and Gynecology

## 2011-02-23 DIAGNOSIS — Z01419 Encounter for gynecological examination (general) (routine) without abnormal findings: Secondary | ICD-10-CM | POA: Insufficient documentation

## 2014-05-06 ENCOUNTER — Encounter (HOSPITAL_COMMUNITY): Payer: Self-pay

## 2014-07-18 ENCOUNTER — Encounter (HOSPITAL_COMMUNITY): Payer: Self-pay | Admitting: Obstetrics and Gynecology

## 2014-12-12 ENCOUNTER — Encounter (HOSPITAL_COMMUNITY): Payer: Self-pay | Admitting: Obstetrics and Gynecology

## 2014-12-13 ENCOUNTER — Emergency Department
Admission: EM | Admit: 2014-12-13 | Discharge: 2014-12-13 | Disposition: A | Discharge status: Home / Self Care | Payer: Medicaid Other | Attending: Emergency Medicine | Admitting: Emergency Medicine

## 2014-12-13 ENCOUNTER — Emergency Department: Payer: Medicaid Other

## 2014-12-13 DIAGNOSIS — Y9389 Activity, other specified: Secondary | ICD-10-CM | POA: Diagnosis not present

## 2014-12-13 DIAGNOSIS — S3992XA Unspecified injury of lower back, initial encounter: Secondary | ICD-10-CM | POA: Diagnosis present

## 2014-12-13 DIAGNOSIS — Y9289 Other specified places as the place of occurrence of the external cause: Secondary | ICD-10-CM | POA: Diagnosis not present

## 2014-12-13 DIAGNOSIS — Z79899 Other long term (current) drug therapy: Secondary | ICD-10-CM | POA: Insufficient documentation

## 2014-12-13 DIAGNOSIS — Z87891 Personal history of nicotine dependence: Secondary | ICD-10-CM | POA: Diagnosis not present

## 2014-12-13 DIAGNOSIS — W1839XA Other fall on same level, initial encounter: Secondary | ICD-10-CM | POA: Insufficient documentation

## 2014-12-13 DIAGNOSIS — M461 Sacroiliitis, not elsewhere classified: Secondary | ICD-10-CM

## 2014-12-13 DIAGNOSIS — Y998 Other external cause status: Secondary | ICD-10-CM | POA: Diagnosis not present

## 2014-12-13 MED ORDER — IBUPROFEN 800 MG PO TABS: 800.0000 mg | ORAL_TABLET | Freq: Three times a day (TID) | ORAL | Status: DC | PRN

## 2014-12-13 NOTE — ED Notes (Signed)
Pt states that her left lower leg started hurting on Sunday and the right thigh started hurting last night. There was no significant event that happened that lead to the pain.  Both sites appear to be normal with no swelling . Pt able to bear weight and walk.ROM appears normal. Pt appears in no distress , airway seems intact and the color is within normal limits.

## 2014-12-13 NOTE — ED Provider Notes (Signed)
[facility] Emergency Department Provider Note  ____________________________________________  Time seen: Approximately 12:50 PM  I have reviewed the triage vital signs and the nursing notes.   HISTORY  Chief Complaint Leg Pain    HPI Brittany Pierce is a 24 y.o. female patient presents with multiple complaints.*Soft with left lower leg pain started to hurt 5 nights ago, than right thigh started to hurt 3 nights ago. Now states that she fell about 3 weeks ago and landed on her tailbone is complaining of tailbone pain.Patient complains of "10 over 10 pain" nonradiating   Past Medical History  Diagnosis Date  . No pertinent past medical history   . Depression     per prenatal    There are no active problems to display for this patient.   Past Surgical History  Procedure Laterality Date  . Hernia repair      Current Outpatient Rx  Name  Route  Sig  Dispense  Refill  . ibuprofen (ADVIL,MOTRIN) 800 MG tablet   Oral   Take 1 tablet (800 mg total) by mouth every 8 (eight) hours as needed.   30 tablet   0   . prenatal vitamin w/FE, FA (PRENATAL 1 + 1) 27-1 MG TABS   Oral   Take 1 tablet by mouth daily.             Allergies Review of patient's allergies indicates no known allergies.  Family History  Problem Relation Age of Onset  . Anesthesia problems Neg Hx   . Hypotension Neg Hx   . Malignant hyperthermia Neg Hx   . Pseudochol deficiency Neg Hx     Social History History  Substance Use Topics  . Smoking status: Former Smoker    Types: Cigarettes    Quit date: 06/11/2010  . Smokeless tobacco: Never Used  . Alcohol Use: No    Review of Systems Constitutional: No fever/chills Eyes: No visual changes. ENT: No sore throat. Cardiovascular: Denies chest pain. Respiratory: Denies shortness of breath. Gastrointestinal: No abdominal pain.  No nausea, no vomiting.  No diarrhea.  No constipation. Genitourinary: Negative for  dysuria. Musculoskeletal: Negative for back pain. Skin: Negative for rash. Neurological: Negative for headaches, focal weakness or numbness.  10-point ROS otherwise negative.  ____________________________________________   PHYSICAL EXAM:  VITAL SIGNS: ED Triage Vitals  Enc Vitals Group     BP 12/13/14 1151 129/74 mmHg     Pulse Rate 12/13/14 1151 96     Resp 12/13/14 1151 15     Temp 12/13/14 1151 98.1 F (36.7 C)     Temp src --      SpO2 12/13/14 1151 96 %     Weight 12/13/14 1151 171 lb (77.565 kg)     Height 12/13/14 1151 5\' 4"  (1.626 m)     Head Cir --      Peak Flow --      Pain Score 12/13/14 1205 10     Pain Loc --      Pain Edu? --      Excl. in GC? --     Constitutional: Alert and oriented. Well appearing and in no acute distress. Eyes: Conjunctivae are normal. PERRL. EOMI. Head: Atraumatic. Nose: No congestion/rhinnorhea. Mouth/Throat: Mucous membranes are moist.  Oropharynx non-erythematous. Neck: No stridor.   Cardiovascular: Normal rate, regular rhythm. Grossly normal heart sounds.  Good peripheral circulation. Respiratory: Normal respiratory effort.  No retractions. Lungs CTAB. Musculoskeletal: No lower extremity tenderness nor edema.  No joint  effusions. No evidence of ecchymosis edema or bruising noted. Ambulance without difficulty. Negative Homans Neurologic:  Normal speech and language. No gross focal neurologic deficits are appreciated. Speech is normal. No gait instability. Skin:  Skin is warm, dry and intact. No rash noted. Psychiatric: Mood and affect are normal. Speech and behavior are normal.  ____________________________________________   LABS (all labs ordered are listed, but only abnormal results are displayed)  Labs Reviewed - No data to display ____________________________________________  EKG  Deferred ____________________________________________  RADIOLOGY Interpreted by radiologist, reviewed by myself.  Negative.  ____________________________________________   PROCEDURES  Procedure(s) performed: None  Critical Care performed: No  ____________________________________________   INITIAL IMPRESSION / ASSESSMENT AND PLAN / ED COURSE  Pertinent labs & imaging results that were available during my care of the patient were reviewed by me and considered in my medical decision making (see chart for details).  Coccyx contusion and nonspecific contusion to right leg ____________________________________________   FINAL CLINICAL IMPRESSION(S) / ED DIAGNOSES  Final diagnoses:  Sacroiliitis      Evangeline Dakin, PA-C 12/13/14 1407  Sharyn Creamer, MD 12/15/14 1513

## 2014-12-13 NOTE — ED Notes (Addendum)
Patient c/o left calf pain since last Sunday and then started experiencing right thigh pain today. Denies any recent injury. Denies any hx of blood clots. Ambulatory to triage with no obvious distress.

## 2015-06-05 ENCOUNTER — Emergency Department
Admission: EM | Admit: 2015-06-05 | Discharge: 2015-06-05 | Disposition: A | Discharge status: Home / Self Care | Payer: Medicaid Other | Attending: Emergency Medicine | Admitting: Emergency Medicine

## 2015-06-05 DIAGNOSIS — J029 Acute pharyngitis, unspecified: Secondary | ICD-10-CM | POA: Diagnosis not present

## 2015-06-05 DIAGNOSIS — Z87891 Personal history of nicotine dependence: Secondary | ICD-10-CM | POA: Insufficient documentation

## 2015-06-05 DIAGNOSIS — Z79899 Other long term (current) drug therapy: Secondary | ICD-10-CM | POA: Diagnosis not present

## 2015-06-05 LAB — POCT RAPID STREP A: Streptococcus, Group A Screen (Direct): NEGATIVE

## 2015-06-05 MED ORDER — AMOXICILLIN 500 MG PO TABS: 500.0000 mg | ORAL_TABLET | Freq: Three times a day (TID) | ORAL | Status: DC

## 2015-06-05 NOTE — ED Notes (Signed)
Pt presents to ED with c/o sore throat since last Saturday. Pt reports N/V this past weekend, but denies diarrhea. Pt reports fever of 101.3 orally. Pt reports taking IBU and TheraFlu OTC w/o significant relief. Pt is A&O, in NAD, with respirations even, regular, and unlabored.

## 2015-06-05 NOTE — ED Provider Notes (Signed)
[facility] Emergency Department Provider Note  ____________________________________________  Time seen: Approximately 9:17 PM  I have reviewed the triage vital signs and the nursing notes.   HISTORY  Chief Complaint Sore Throat    HPI Addysen Louth is a 24 y.o. female with proximal and one week of URI symptoms, started with a fever and congestion. Has had sinus pressure and now worsening sore throat with mild cough. She noticed white spots on the back of her throat.   Past Medical History  Diagnosis Date  . No pertinent past medical history   . Depression     per prenatal    There are no active problems to display for this patient.   Past Surgical History  Procedure Laterality Date  . Hernia repair      Current Outpatient Rx  Name  Route  Sig  Dispense  Refill  . amoxicillin (AMOXIL) 500 MG tablet   Oral   Take 1 tablet (500 mg total) by mouth 3 (three) times daily.   30 tablet   0   . ibuprofen (ADVIL,MOTRIN) 800 MG tablet   Oral   Take 1 tablet (800 mg total) by mouth every 8 (eight) hours as needed.   30 tablet   0   . prenatal vitamin w/FE, FA (PRENATAL 1 + 1) 27-1 MG TABS   Oral   Take 1 tablet by mouth daily.             Allergies Review of patient's allergies indicates no known allergies.  Family History  Problem Relation Age of Onset  . Anesthesia problems Neg Hx   . Hypotension Neg Hx   . Malignant hyperthermia Neg Hx   . Pseudochol deficiency Neg Hx     Social History Social History  Substance Use Topics  . Smoking status: Former Smoker    Types: Cigarettes    Quit date: 06/11/2010  . Smokeless tobacco: Never Used  . Alcohol Use: No    Review of Systems Constitutional: No fever/chills Eyes: No visual changes. ENT: per HPI Cardiovascular: Denies chest pain. Respiratory: Denies shortness of breath. Gastrointestinal: No abdominal pain.  No nausea, no vomiting.  No diarrhea.  No  constipation. Genitourinary: Negative for dysuria. Musculoskeletal: Negative for back pain. Skin: Negative for rash. Neurological: Negative for headaches, focal weakness or numbness. 10-point ROS otherwise negative.  ____________________________________________   PHYSICAL EXAM:  VITAL SIGNS: ED Triage Vitals  Enc Vitals Group     BP 06/05/15 2014 133/85 mmHg     Pulse Rate 06/05/15 2014 112     Resp 06/05/15 2014 18     Temp 06/05/15 2014 98.8 F (37.1 C)     Temp Source 06/05/15 2014 Oral     SpO2 06/05/15 2014 100 %     Weight 06/05/15 2014 176 lb (79.833 kg)     Height 06/05/15 2014  (1.626 m)     Head Cir --      Peak Flow --      Pain Score 06/05/15 2111 5     Pain Loc --      Pain Edu? --      Excl. in GC? --     Constitutional: Alert and oriented. Well appearing and in no acute distress. Eyes: Conjunctivae are normal. PERRL. EOMI. Ears:  Clear with normal landmarks. No erythema. Head: Atraumatic. Nose: No congestion/rhinnorhea. Mouth/Throat: Mucous membranes are moist.  Oropharynx -erythematous with few white patches on tonsils. No lesions. Neck:  Supple.  TENDER  Cervical, tonsilar adenopathy.   Cardiovascular: Normal rate, regular rhythm. Grossly normal heart sounds.  Good peripheral circulation. Respiratory: Normal respiratory effort.  No retractions. Lungs CTAB. Musculoskeletal: Nml ROM of upper and lower extremity joints. Neurologic:  Normal speech and language. No gross focal neurologic deficits are appreciated. No gait instability. Skin:  Skin is warm, dry and intact. No rash noted. Psychiatric: Mood and affect are normal. Speech and behavior are normal.  ____________________________________________   LABS (all labs ordered are listed, but only abnormal results are displayed)  Labs Reviewed  POCT RAPID STREP A    ____________________________________________  EKG   ____________________________________________  RADIOLOGY   ____________________________________________   PROCEDURES  Procedure(s) performed: None  Critical Care performed: No  ____________________________________________   INITIAL IMPRESSION / ASSESSMENT AND PLAN / ED COURSE  Pertinent labs & imaging results that were available during my care of the patient were reviewed by me and considered in my medical decision making (see chart for details).  24 year old with sore throat and sinus congestion. Suspect viral URI. Negative strep. Encouraged ibuprofen for pain control and over-the-counter medicine as needed. If symptoms worsen she may begin amoxicillin. She will follow-up as needed. ____________________________________________   FINAL CLINICAL IMPRESSION(S) / ED DIAGNOSES  Final diagnoses:  Pharyngitis      Ignacia BayleyRobert Senai Kingsley, PA-C 06/05/15 2137  Darien Ramusavid W Kaminski, MD 06/05/15 2342

## 2015-06-05 NOTE — ED Notes (Signed)
POCT Rapid Strep A resulted= NEGATIVE

## 2015-06-05 NOTE — Discharge Instructions (Signed)
Pharyngitis Pharyngitis is redness, pain, and swelling (inflammation) of your pharynx.  CAUSES  Pharyngitis is usually caused by infection. Most of the time, these infections are from viruses (viral) and are part of a cold. However, sometimes pharyngitis is caused by bacteria (bacterial). Pharyngitis can also be caused by allergies. Viral pharyngitis may be spread from person to person by coughing, sneezing, and personal items or utensils (cups, forks, spoons, toothbrushes). Bacterial pharyngitis may be spread from person to person by more intimate contact, such as kissing.  SIGNS AND SYMPTOMS  Symptoms of pharyngitis include:   Sore throat.   Tiredness (fatigue).   Low-grade fever.   Headache.  Joint pain and muscle aches.  Skin rashes.  Swollen lymph nodes.  Plaque-like film on throat or tonsils (often seen with bacterial pharyngitis). DIAGNOSIS  Your health care provider will ask you questions about your illness and your symptoms. Your medical history, along with a physical exam, is often all that is needed to diagnose pharyngitis. Sometimes, a rapid strep test is done. Other lab tests may also be done, depending on the suspected cause.  TREATMENT  Viral pharyngitis will usually get better in 3-4 days without the use of medicine. Bacterial pharyngitis is treated with medicines that kill germs (antibiotics).  HOME CARE INSTRUCTIONS   Drink enough water and fluids to keep your urine clear or pale yellow.   Only take over-the-counter or prescription medicines as directed by your health care provider:   If you are prescribed antibiotics, make sure you finish them even if you start to feel better.   Do not take aspirin.   Get lots of rest.   Gargle with 8 oz of salt water ( tsp of salt per 1 qt of water) as often as every 1-2 hours to soothe your throat.   Throat lozenges (if you are not at risk for choking) or sprays may be used to soothe your throat. SEEK MEDICAL  CARE IF:   You have large, tender lumps in your neck.  You have a rash.  You cough up green, yellow-brown, or bloody spit. SEEK IMMEDIATE MEDICAL CARE IF:   Your neck becomes stiff.  You drool or are unable to swallow liquids.  You vomit or are unable to keep medicines or liquids down.  You have severe pain that does not go away with the use of recommended medicines.  You have trouble breathing (not caused by a stuffy nose). MAKE SURE YOU:   Understand these instructions.  Will watch your condition.  Will get help right away if you are not doing well or get worse.   This information is not intended to replace advice given to you by your health care provider. Make sure you discuss any questions you have with your health care provider.   Document Released: 06/21/2005 Document Revised: 04/11/2013 Document Reviewed: 02/26/2013 Elsevier Interactive Patient Education 2016 [employer]   Continue ibuprofen as needed for pain. If symptoms worsen or simply persist, you can try amoxicillin. Return to emergency room for any worsening symptoms.

## 2015-11-08 ENCOUNTER — Emergency Department (HOSPITAL_COMMUNITY)
Admission: EM | Admit: 2015-11-08 | Discharge: 2015-11-08 | Disposition: A | Discharge status: Home / Self Care | Payer: Medicaid Other | Attending: Emergency Medicine | Admitting: Emergency Medicine

## 2015-11-08 ENCOUNTER — Emergency Department (HOSPITAL_COMMUNITY): Payer: Medicaid Other

## 2015-11-08 ENCOUNTER — Encounter (HOSPITAL_COMMUNITY): Payer: Self-pay | Admitting: Emergency Medicine

## 2015-11-08 DIAGNOSIS — Z792 Long term (current) use of antibiotics: Secondary | ICD-10-CM | POA: Diagnosis not present

## 2015-11-08 DIAGNOSIS — Z791 Long term (current) use of non-steroidal anti-inflammatories (NSAID): Secondary | ICD-10-CM | POA: Diagnosis not present

## 2015-11-08 DIAGNOSIS — Z87891 Personal history of nicotine dependence: Secondary | ICD-10-CM | POA: Diagnosis not present

## 2015-11-08 DIAGNOSIS — F329 Major depressive disorder, single episode, unspecified: Secondary | ICD-10-CM | POA: Diagnosis not present

## 2015-11-08 DIAGNOSIS — R1013 Epigastric pain: Secondary | ICD-10-CM | POA: Diagnosis present

## 2015-11-08 DIAGNOSIS — Z79899 Other long term (current) drug therapy: Secondary | ICD-10-CM | POA: Diagnosis not present

## 2015-11-08 LAB — COMPREHENSIVE METABOLIC PANEL
ALT: 11 U/L — ABNORMAL LOW (ref 14–54)
AST: 18 U/L (ref 15–41)
Albumin: 4 g/dL (ref 3.5–5.0)
Alkaline Phosphatase: 65 U/L (ref 38–126)
Anion gap: 7 (ref 5–15)
BILIRUBIN TOTAL: 0.3 mg/dL (ref 0.3–1.2)
BUN: 6 mg/dL (ref 6–20)
CALCIUM: 9.2 mg/dL (ref 8.9–10.3)
CO2: 25 mmol/L (ref 22–32)
CREATININE: 0.74 mg/dL (ref 0.44–1.00)
Chloride: 110 mmol/L (ref 101–111)
GFR calc Af Amer: >60 mL/min (ref >60)
Glucose, Bld: 102 mg/dL — ABNORMAL HIGH (ref 65–99)
Potassium: 4.2 mmol/L (ref 3.5–5.1)
Sodium: 142 mmol/L (ref 135–145)
TOTAL PROTEIN: 7.3 g/dL (ref 6.5–8.1)

## 2015-11-08 LAB — URINALYSIS, ROUTINE W REFLEX MICROSCOPIC
Bilirubin Urine: NEGATIVE
GLUCOSE, UA: NEGATIVE mg/dL
HGB URINE DIPSTICK: NEGATIVE
KETONES UR: NEGATIVE mg/dL
Leukocytes, UA: NEGATIVE
Nitrite: NEGATIVE
PH: 6.5 (ref 5.0–8.0)
PROTEIN: NEGATIVE mg/dL
Specific Gravity, Urine: 1.021 (ref 1.005–1.030)

## 2015-11-08 LAB — POC URINE PREG, ED: Preg Test, Ur: NEGATIVE

## 2015-11-08 LAB — LIPASE, BLOOD: Lipase: 29 U/L (ref 11–51)

## 2015-11-08 LAB — CBC
HEMATOCRIT: 35.1 % — AB (ref 36.0–46.0)
Hemoglobin: 11.2 g/dL — ABNORMAL LOW (ref 12.0–15.0)
MCH: 24.6 pg — AB (ref 26.0–34.0)
MCHC: 31.9 g/dL (ref 30.0–36.0)
MCV: 77.1 fL — AB (ref 78.0–100.0)
PLATELETS: 377 10*3/uL (ref 150–400)
RBC: 4.55 MIL/uL (ref 3.87–5.11)
RDW: 15.4 % (ref 11.5–15.5)
WBC: 7.6 10*3/uL (ref 4.0–10.5)

## 2015-11-08 MED ORDER — SUCRALFATE 1 G PO TABS: 1.0000 g | ORAL_TABLET | Freq: Four times a day (QID) | ORAL | Status: DC

## 2015-11-08 MED ORDER — MORPHINE SULFATE (PF) 4 MG/ML IV SOLN
4.0000 mg | Freq: Once | INTRAVENOUS | Status: DC
  Filled 2015-11-08: qty 1

## 2015-11-08 MED ORDER — MORPHINE SULFATE (PF) 4 MG/ML IV SOLN
4.0000 mg | Freq: Once | INTRAVENOUS | Status: AC
Start: 2015-11-08 — End: 2015-11-08
  Administered 2015-11-08: 4 mg via INTRAMUSCULAR

## 2015-11-08 MED ORDER — SUCRALFATE 1 G PO TABS
1.0000 g | ORAL_TABLET | Freq: Once | ORAL | Status: AC
  Administered 2015-11-08: 1 g via ORAL
  Filled 2015-11-08: qty 1

## 2015-11-08 MED ORDER — SODIUM CHLORIDE 0.9 % IV BOLUS (SEPSIS)
1000.0000 mL | Freq: Once | INTRAVENOUS | Status: AC
  Administered 2015-11-08: 1000 mL via INTRAVENOUS

## 2015-11-08 MED ORDER — DIATRIZOATE MEGLUMINE & SODIUM 66-10 % PO SOLN
30.0000 mL | Freq: Once | ORAL | Status: AC
  Administered 2015-11-08: 30 mL via ORAL

## 2015-11-08 MED ORDER — FAMOTIDINE 20 MG PO TABS: 20.0000 mg | ORAL_TABLET | Freq: Two times a day (BID) | ORAL | Status: DC

## 2015-11-08 MED ORDER — SODIUM CHLORIDE 0.9 % IV SOLN
INTRAVENOUS | Status: DC
  Administered 2015-11-08: 18:00:00 via INTRAVENOUS

## 2015-11-08 MED ORDER — FAMOTIDINE 20 MG PO TABS
40.0000 mg | ORAL_TABLET | Freq: Once | ORAL | Status: AC
  Administered 2015-11-08: 40 mg via ORAL
  Filled 2015-11-08: qty 2

## 2015-11-08 MED ORDER — IOPAMIDOL (ISOVUE-300) INJECTION 61%
100.0000 mL | Freq: Once | INTRAVENOUS | Status: AC | PRN
  Administered 2015-11-08: 100 mL via INTRAVENOUS

## 2015-11-08 MED ORDER — FAMOTIDINE IN NACL 20-0.9 MG/50ML-% IV SOLN
20.0000 mg | Freq: Once | INTRAVENOUS | Status: DC
  Filled 2015-11-08: qty 50

## 2015-11-08 MED ORDER — LORAZEPAM 2 MG/ML IJ SOLN
1.0000 mg | Freq: Once | INTRAMUSCULAR | Status: AC
  Administered 2015-11-08: 1 mg via INTRAVENOUS
  Filled 2015-11-08: qty 1

## 2015-11-08 MED ORDER — ONDANSETRON 4 MG PO TBDP
4.0000 mg | ORAL_TABLET | Freq: Once | ORAL | Status: AC | PRN
  Administered 2015-11-08: 4 mg via ORAL
  Filled 2015-11-08: qty 1

## 2015-11-08 MED ORDER — ONDANSETRON HCL 4 MG/2ML IJ SOLN
4.0000 mg | Freq: Once | INTRAMUSCULAR | Status: DC
  Filled 2015-11-08: qty 2

## 2015-11-08 MED ORDER — GI COCKTAIL ~~LOC~~
30.0000 mL | Freq: Once | ORAL | Status: AC
  Administered 2015-11-08: 30 mL via ORAL
  Filled 2015-11-08: qty 30

## 2015-11-08 MED ORDER — OXYCODONE-ACETAMINOPHEN 5-325 MG PO TABS: 1.0000 | ORAL_TABLET | ORAL | Status: DC | PRN

## 2015-11-08 NOTE — ED Notes (Signed)
Patient transported to CT 

## 2015-11-08 NOTE — ED Notes (Signed)
Per pt, states abdominal pain that started this am-vomiting 

## 2015-11-08 NOTE — ED Provider Notes (Signed)
CSN: [id number]     Arrival date & time 11/08/15  1307 History   First MD Initiated Contact with Patient 11/08/15 1358     Chief Complaint  Patient presents with  . Abdominal Pain     (Consider location/radiation/quality/duration/timing/severity/associated sxs/prior Treatment) HPI Comments: Patient here complaining of midepigastric pain that radiates to her back that began this morning. Patient recently started on doxycycline for possible STI. She has had nonbilious emesis without fever or diarrhea. Pain characterized as burning. Denies any urinary symptoms. No current vaginal bleeding. Does have been aggressively worse today and concur with she first woke up. She did attempt to eat without success.  Patient is a 25 y.o. female presenting with abdominal pain. The history is provided by the patient.  Abdominal Pain   Past Medical History  Diagnosis Date  . No pertinent past medical history   . Depression     per prenatal   Past Surgical History  Procedure Laterality Date  . Hernia repair     Family History  Problem Relation Age of Onset  . Anesthesia problems Neg Hx   . Hypotension Neg Hx   . Malignant hyperthermia Neg Hx   . Pseudochol deficiency Neg Hx    Social History  Substance Use Topics  . Smoking status: Former Smoker    Types: Cigarettes    Quit date: 06/11/2010  . Smokeless tobacco: Never Used  . Alcohol Use: No   OB History    Gravida Para Term Preterm AB TAB SAB Ectopic Multiple Living   1 1 1  0 0 0 0 0 0 1     Review of Systems  Gastrointestinal: Positive for abdominal pain.  All other systems reviewed and are negative.     Allergies  Review of patient's allergies indicates no known allergies.  Home Medications   Prior to Admission medications   Medication Sig Start Date End Date Taking? Authorizing Provider  amoxicillin (AMOXIL) 500 MG tablet Take 1 tablet (500 mg total) by mouth 3 (three) times daily. 06/05/15   Ignacia Bayleyobert Tumey, PA-C  ibuprofen  (ADVIL,MOTRIN) 800 MG tablet Take 1 tablet (800 mg total) by mouth every 8 (eight) hours as needed. 12/13/14   Evangeline Dakinharles M Beers, PA-C  prenatal vitamin w/FE, FA (PRENATAL 1 + 1) 27-1 MG TABS Take 1 tablet by mouth daily.      Historical Provider, MD   BP 130/62 mmHg  Pulse 113  Temp(Src) 98.4 F (36.9 C) (Oral)  Resp 18  Ht 5\' 4"  (1.626 m)  Wt 80.74 kg  BMI 30.54 kg/m2  SpO2 99%  LMP 10/23/2015 Physical Exam  Constitutional: She is oriented to person, place, and time. She appears well-developed and well-nourished.  Non-toxic appearance. No distress.  HENT:  Head: Normocephalic and atraumatic.  Eyes: Conjunctivae, EOM and lids are normal. Pupils are equal, round, and reactive to light.  Neck: Normal range of motion. Neck supple. No tracheal deviation present. No thyroid mass present.  Cardiovascular: Normal rate, regular rhythm and normal heart sounds.  Exam reveals no gallop.   No murmur heard. Pulmonary/Chest: Effort normal and breath sounds normal. No stridor. No respiratory distress. She has no decreased breath sounds. She has no wheezes. She has no rhonchi. She has no rales.  Abdominal: Soft. Normal appearance and bowel sounds are normal. She exhibits no distension. There is tenderness in the epigastric area. There is no rigidity, no rebound, no guarding and no CVA tenderness.    Musculoskeletal: Normal range of motion. She exhibits  no edema or tenderness.  Neurological: She is alert and oriented to person, place, and time. She has normal strength. No cranial nerve deficit or sensory deficit. GCS eye subscore is 4. GCS verbal subscore is 5. GCS motor subscore is 6.  Skin: Skin is warm and dry. No abrasion and no rash noted.  Psychiatric: She has a normal mood and affect. Her speech is normal and behavior is normal.  Nursing note and vitals reviewed.   ED Course  Procedures (including critical care time) Labs Review Labs Reviewed  CBC - Abnormal; Notable for the following:     Hemoglobin 11.2 (*)    HCT 35.1 (*)    MCV 77.1 (*)    MCH 24.6 (*)    All other components within normal limits  LIPASE, BLOOD  COMPREHENSIVE METABOLIC PANEL  URINALYSIS, ROUTINE W REFLEX MICROSCOPIC (NOT AT [facility])  I-STAT BETA HCG BLOOD, ED ([facility], [facility], [facility] ONLY)    Imaging Review No results found. I have personally reviewed and evaluated these images and lab results as part of my medical decision-making.   EKG Interpretation None      MDM   Final diagnoses:  None    Patient given IV medications as well as fluids. Suspect that she has gastritis likely from her use of doxycycline. She had severe mid epigastric pain and had an abdominal CT was did not show any acute findings. He is feeling better after her meds and will be discharged home.    Lorre Nick, MD 11/08/15 2105

## 2015-11-08 NOTE — ED Notes (Signed)
Patient d/c'd self care.  F/U and medications reviewed.  Patient verbalized understanding. 

## 2015-11-08 NOTE — Discharge Instructions (Signed)
Abdominal Pain, Adult °Many things can cause abdominal pain. Usually, abdominal pain is not caused by a disease and will improve without treatment. It can often be observed and treated at home. Your health care provider will do a physical exam and possibly order blood tests and X-rays to help determine the seriousness of your pain. However, in many cases, more time must pass before a clear cause of the pain can be found. Before that point, your health care provider may not know if you need more testing or further treatment. °HOME CARE INSTRUCTIONS °Monitor your abdominal pain for any changes. The following actions may help to alleviate any discomfort you are experiencing: °· Only take over-the-counter or prescription medicines as directed by your health care provider. °· Do not take laxatives unless directed to do so by your health care provider. °· Try a clear liquid diet (broth, tea, or water) as directed by your health care provider. Slowly move to a bland diet as tolerated. °SEEK MEDICAL CARE IF: °· You have unexplained abdominal pain. °· You have abdominal pain associated with nausea or diarrhea. °· You have pain when you urinate or have a bowel movement. °· You experience abdominal pain that wakes you in the night. °· You have abdominal pain that is worsened or improved by eating food. °· You have abdominal pain that is worsened with eating fatty foods. °· You have a fever. °SEEK IMMEDIATE MEDICAL CARE IF: °· Your pain does not go away within 2 hours. °· You keep throwing up (vomiting). °· Your pain is felt only in portions of the abdomen, such as the right side or the left lower portion of the abdomen. °· You pass bloody or black tarry stools. °MAKE SURE YOU: °· Understand these instructions. °· Will watch your condition. °· Will get help right away if you are not doing well or get worse. °  °This information is not intended to replace advice given to you by your health care provider. Make sure you discuss  any questions you have with your health care provider. °  °Document Released: 03/31/2005 Document Revised: 03/12/2015 Document Reviewed: 02/28/2013 °Elsevier Interactive Patient Education ©2016 [employer] ° °Gastritis, Adult °Gastritis is soreness and swelling (inflammation) of the lining of the stomach. Gastritis can develop as a sudden onset (acute) or long-term (chronic) condition. If gastritis is not treated, it can lead to stomach bleeding and ulcers. °CAUSES  °Gastritis occurs when the stomach lining is weak or damaged. Digestive juices from the stomach then inflame the weakened stomach lining. The stomach lining may be weak or damaged due to viral or bacterial infections. One common bacterial infection is the Helicobacter pylori infection. Gastritis can also result from excessive alcohol consumption, taking certain medicines, or having too much acid in the stomach.  °SYMPTOMS  °In some cases, there are no symptoms. When symptoms are present, they may include: °· Pain or a burning sensation in the upper abdomen. °· Nausea. °· Vomiting. °· An uncomfortable feeling of fullness after eating. °DIAGNOSIS  °Your caregiver may suspect you have gastritis based on your symptoms and a physical exam. To determine the cause of your gastritis, your caregiver may perform the following: °· Blood or stool tests to check for the H pylori bacterium. °· Gastroscopy. A thin, flexible tube (endoscope) is passed down the esophagus and into the stomach. The endoscope has a light and camera on the end. Your caregiver uses the endoscope to view the inside of the stomach. °· Taking a tissue sample (  biopsy) from the stomach to examine under a microscope. °TREATMENT  °Depending on the cause of your gastritis, medicines may be prescribed. If you have a bacterial infection, such as an H pylori infection, antibiotics may be given. If your gastritis is caused by too much acid in the stomach, H2 blockers or antacids may be given. Your  caregiver may recommend that you stop taking aspirin, ibuprofen, or other nonsteroidal anti-inflammatory drugs (NSAIDs). °HOME CARE INSTRUCTIONS °· Only take over-the-counter or prescription medicines as directed by your caregiver. °· If you were given antibiotic medicines, take them as directed. Finish them even if you start to feel better. °· Drink enough fluids to keep your urine clear or pale yellow. °· Avoid foods and drinks that make your symptoms worse, such as: °¨ Caffeine or alcoholic drinks. °¨ Chocolate. °¨ Peppermint or mint flavorings. °¨ Garlic and onions. °¨ Spicy foods. °¨ Citrus fruits, such as oranges, lemons, or limes. °¨ Tomato-based foods such as sauce, chili, salsa, and pizza. °¨ Fried and fatty foods. °· Eat small, frequent meals instead of large meals. °SEEK IMMEDIATE MEDICAL CARE IF:  °· You have black or dark red stools. °· You vomit blood or material that looks like coffee grounds. °· You are unable to keep fluids down. °· Your abdominal pain gets worse. °· You have a fever. °· You do not feel better after 1 week. °· You have any other questions or concerns. °MAKE SURE YOU: °· Understand these instructions. °· Will watch your condition. °· Will get help right away if you are not doing well or get worse. °  °This information is not intended to replace advice given to you by your health care provider. Make sure you discuss any questions you have with your health care provider. °  °Document Released: 06/15/2001 Document Revised: 12/21/2011 Document Reviewed: 08/04/2011 °Elsevier Interactive Patient Education ©2016 [employer]. ° °

## 2015-11-08 NOTE — ED Notes (Signed)
Pt made aware of need for urine sample, states unable to void at this time.

## 2015-12-12 ENCOUNTER — Other Ambulatory Visit: Payer: Self-pay | Admitting: Family Medicine

## 2015-12-12 DIAGNOSIS — R102 Pelvic and perineal pain: Secondary | ICD-10-CM

## 2015-12-19 ENCOUNTER — Ambulatory Visit: Payer: Medicaid Other

## 2018-06-23 ENCOUNTER — Other Ambulatory Visit: Payer: Self-pay

## 2018-06-23 ENCOUNTER — Emergency Department (HOSPITAL_BASED_OUTPATIENT_CLINIC_OR_DEPARTMENT_OTHER)
Admission: EM | Admit: 2018-06-23 | Discharge: 2018-06-24 | Disposition: A | Discharge status: Home / Self Care | Payer: Self-pay | Attending: Emergency Medicine | Admitting: Emergency Medicine

## 2018-06-23 ENCOUNTER — Encounter (HOSPITAL_BASED_OUTPATIENT_CLINIC_OR_DEPARTMENT_OTHER): Payer: Self-pay

## 2018-06-23 ENCOUNTER — Emergency Department (HOSPITAL_BASED_OUTPATIENT_CLINIC_OR_DEPARTMENT_OTHER): Payer: Self-pay

## 2018-06-23 DIAGNOSIS — O99331 Smoking (tobacco) complicating pregnancy, first trimester: Secondary | ICD-10-CM | POA: Insufficient documentation

## 2018-06-23 DIAGNOSIS — O200 Threatened abortion: Secondary | ICD-10-CM | POA: Insufficient documentation

## 2018-06-23 DIAGNOSIS — Z3A01 Less than 8 weeks gestation of pregnancy: Secondary | ICD-10-CM | POA: Insufficient documentation

## 2018-06-23 DIAGNOSIS — O99341 Other mental disorders complicating pregnancy, first trimester: Secondary | ICD-10-CM | POA: Insufficient documentation

## 2018-06-23 DIAGNOSIS — F17210 Nicotine dependence, cigarettes, uncomplicated: Secondary | ICD-10-CM | POA: Insufficient documentation

## 2018-06-23 DIAGNOSIS — Z79899 Other long term (current) drug therapy: Secondary | ICD-10-CM | POA: Insufficient documentation

## 2018-06-23 DIAGNOSIS — O4690 Antepartum hemorrhage, unspecified, unspecified trimester: Secondary | ICD-10-CM

## 2018-06-23 LAB — CBC WITH DIFFERENTIAL/PLATELET
Abs Immature Granulocytes: 0.01 10*3/uL (ref 0.00–0.07)
Basophils Absolute: 0.1 10*3/uL (ref 0.0–0.1)
Basophils Relative: 1 %
Eosinophils Absolute: 0 10*3/uL (ref 0.0–0.5)
Eosinophils Relative: 0 %
HCT: 32.2 % — ABNORMAL LOW (ref 36.0–46.0)
Hemoglobin: 9.9 g/dL — ABNORMAL LOW (ref 12.0–15.0)
Immature Granulocytes: 0 %
Lymphocytes Relative: 46 %
Lymphs Abs: 3.6 10*3/uL (ref 0.7–4.0)
MCH: 26 pg (ref 26.0–34.0)
MCHC: 30.7 g/dL (ref 30.0–36.0)
MCV: 84.5 fL (ref 80.0–100.0)
Monocytes Absolute: 0.7 10*3/uL (ref 0.1–1.0)
Monocytes Relative: 8 %
NRBC: 0 % (ref 0.0–0.2)
Neutro Abs: 3.5 10*3/uL (ref 1.7–7.7)
Neutrophils Relative %: 45 %
Platelets: 297 10*3/uL (ref 150–400)
RBC: 3.81 MIL/uL — ABNORMAL LOW (ref 3.87–5.11)
RDW: 15.9 % — ABNORMAL HIGH (ref 11.5–15.5)
WBC: 7.8 10*3/uL (ref 4.0–10.5)

## 2018-06-23 LAB — PREGNANCY, URINE: PREG TEST UR: POSITIVE — AB

## 2018-06-23 NOTE — ED Notes (Signed)
Patient transported to Ultrasound 

## 2018-06-23 NOTE — ED Provider Notes (Signed)
[facility] EMERGENCY DEPARTMENT Provider Note   CSN: [id number] Arrival date & time: 06/23/18  2233     History   Chief Complaint Chief Complaint  Patient presents with  . Vaginal Bleeding    HPI Brittany Pierce is a 27 y.o. female.  Patient is a 27 year old female G3 P1-0-1-1 at estimated [redacted] weeks gestation presenting for evaluation of vaginal bleeding and lower abdominal cramping.  This started earlier this evening.  She states she had a positive pregnancy test and is estimated to be [redacted] weeks pregnant.  She reports passing dark red blood along with clots.  This has since slowed.  She denies any lightheadedness, dizziness, chest pain, or difficulty breathing.  The history is provided by the patient.  Vaginal Bleeding  Primary symptoms include vaginal bleeding. There has been no fever. This is a new problem.    Past Medical History:  Diagnosis Date  . Depression    per prenatal  . No pertinent past medical history     There are no active problems to display for this patient.   Past Surgical History:  Procedure Laterality Date  . HERNIA REPAIR       OB History    Gravida  3   Para  1   Term  1   Preterm  0   AB  1   Living  1     SAB  1   TAB  0   Ectopic  0   Multiple  0   Live Births  1            Home Medications    Prior to Admission medications   Medication Sig Start Date End Date Taking? Authorizing Provider  amoxicillin (AMOXIL) 500 MG tablet Take 1 tablet (500 mg total) by mouth 3 (three) times daily. Patient not taking: Reported on 11/08/2015 06/05/15   Ignacia Bayleyumey, Robert, PA-C  doxycycline (VIBRA-TABS) 100 MG tablet Take 100 mg by mouth 2 (two) times daily.    [provider]  famotidine (PEPCID) 20 MG tablet Take 1 tablet (20 mg total) by mouth 2 (two) times daily. 11/08/15   Lorre NickAllen, Anthony, MD  ibuprofen (ADVIL,MOTRIN) 800 MG tablet Take 1 tablet (800 mg total) by mouth every 8 (eight) hours as needed. Patient not  taking: Reported on 11/08/2015 12/13/14   Beers, Charmayne Sheerharles M, PA-C  oxyCODONE-acetaminophen (PERCOCET/ROXICET) 5-325 MG tablet Take 1-2 tablets by mouth every 4 (four) hours as needed for severe pain. 11/08/15   Lorre NickAllen, Anthony, MD  pregabalin (LYRICA) 150 MG capsule Take 150 mg by mouth once.    [provider]  sucralfate (CARAFATE) 1 g tablet Take 1 tablet (1 g total) by mouth 4 (four) times daily. 11/08/15   Lorre NickAllen, Anthony, MD    Family History Family History  Problem Relation Age of Onset  . Anesthesia problems Neg Hx   . Hypotension Neg Hx   . Malignant hyperthermia Neg Hx   . Pseudochol deficiency Neg Hx     Social History Social History   Tobacco Use  . Smoking status: Current Every Day Smoker    Types: Cigarettes  . Smokeless tobacco: Never Used  Substance Use Topics  . Alcohol use: No  . Drug use: Yes    Types: Marijuana     Allergies   Patient has no known allergies.   Review of Systems Review of Systems  Genitourinary: Positive for vaginal bleeding.  All other systems reviewed and are negative.    Physical  Exam Updated Vital Signs BP (!) 129/53 (BP Location: Left Arm)   Pulse 94   Temp 98.5 F (36.9 C) (Oral)   Resp 16   Wt 61.3 kg   LMP 05/20/2018   SpO2 100%   BMI 23.19 kg/m   Physical Exam Vitals signs and nursing note reviewed.  Constitutional:      General: She is not in acute distress.    Appearance: She is well-developed. She is not diaphoretic.  HENT:     Head: Normocephalic and atraumatic.  Neck:     Musculoskeletal: Normal range of motion and neck supple.  Cardiovascular:     Rate and Rhythm: Normal rate and regular rhythm.     Heart sounds: No murmur. No friction rub. No gallop.   Pulmonary:     Effort: Pulmonary effort is normal. No respiratory distress.     Breath sounds: Normal breath sounds. No wheezing.  Abdominal:     General: Bowel sounds are normal. There is no distension.     Palpations: Abdomen is soft.      Tenderness: There is abdominal tenderness. There is no guarding or rebound.     Comments: There is mild tenderness in the suprapubic region and left lower quadrant.  Musculoskeletal: Normal range of motion.  Skin:    General: Skin is warm and dry.  Neurological:     Mental Status: She is alert and oriented to person, place, and time.      ED Treatments / Results  Labs (all labs ordered are listed, but only abnormal results are displayed) Labs Reviewed  PREGNANCY, URINE - Abnormal; Notable for the following components:      Result Value   Preg Test, Ur POSITIVE (*)    All other components within normal limits  BASIC METABOLIC PANEL  CBC WITH DIFFERENTIAL/PLATELET  HCG, QUANTITATIVE, PREGNANCY  ABO/RH    EKG None  Radiology No results found.  Procedures Procedures (including critical care time)  Medications Ordered in ED Medications - No data to display   Initial Impression / Assessment and Plan / ED Course  I have reviewed the triage vital signs and the nursing notes.  Pertinent labs & imaging results that were available during my care of the patient were reviewed by me and considered in my medical decision making (see chart for details).  Patient presenting with vaginal bleeding at suspected [redacted] weeks gestation.  Her work-up today reveals a positive urine pregnancy test with quantitative beta hCG of 726.  Her ultrasound reveals no intrauterine pregnancy consistent with either early gestation or miscarriage with ectopic pregnancy not visualized, but not completely ruled out.  She appears hemodynamically stable.  Hemoglobin is 9.9 with no priors on file for close to 2 years.  This is somewhat lower than her prior baseline, however has not had any additional bleeding while in the emergency department.  At this point, I feel as though she is appropriate for discharge.  She will need OB follow-up.  She does not have an OB/GYN at this time and I will advise her to call women's  outpatient clinic to arrange an appointment and go to the MAU if her bleeding worsens or she experiences other problems.  Final Clinical Impressions(s) / ED Diagnoses   Final diagnoses:  None    ED Discharge Orders    None       Geoffery Lyonselo, Camaya Gannett, MD 06/24/18 [phone]

## 2018-06-23 NOTE — ED Triage Notes (Signed)
C/o vaginal bleeding since 430pm-confirmed preg at PCP-4 weeks by LMP-no US- G3 P1 SAb 1

## 2018-06-23 NOTE — ED Notes (Signed)
C/o vag bleeding and abd cramping onset 1620 this pm  States she is 4 weeks preg  Denies vag dc

## 2018-06-24 LAB — BASIC METABOLIC PANEL
Anion gap: 4 — ABNORMAL LOW (ref 5–15)
BUN: 5 mg/dL — ABNORMAL LOW (ref 6–20)
CO2: 23 mmol/L (ref 22–32)
CREATININE: 0.7 mg/dL (ref 0.44–1.00)
Calcium: 8.9 mg/dL (ref 8.9–10.3)
Chloride: 109 mmol/L (ref 98–111)
GFR calc Af Amer: >60 mL/min (ref >60)
GFR calc non Af Amer: >60 mL/min (ref >60)
Glucose, Bld: 96 mg/dL (ref 70–99)
Potassium: 3.4 mmol/L — ABNORMAL LOW (ref 3.5–5.1)
Sodium: 136 mmol/L (ref 135–145)

## 2018-06-24 LAB — HCG, QUANTITATIVE, PREGNANCY: hCG, Beta Chain, Quant, S: 726 m[IU]/mL — ABNORMAL HIGH (ref <5)

## 2018-06-24 LAB — ABO/RH: ABO/RH(D): AB POS

## 2018-06-24 NOTE — Discharge Instructions (Addendum)
Vaginal rest until followed up by OB/GYN.  This means nothing in the vagina, no sex, no tampons, etc.  No heavy lifting or vigorous activity until cleared by OB/GYN.  Follow-up in the women's outpatient clinic on Monday for repeat hormone levels and recheck of your condition.  If you develop worsening bleeding, worsening pain, high fever, or other new and concerning symptoms, you should go [facility] the [facility] admitting [facility].

## 2018-06-28 ENCOUNTER — Inpatient Hospital Stay (HOSPITAL_COMMUNITY): Payer: Medicaid Other

## 2018-06-28 ENCOUNTER — Inpatient Hospital Stay (HOSPITAL_COMMUNITY)
Admission: AD | Admit: 2018-06-28 | Discharge: 2018-06-28 | Disposition: A | Discharge status: Home / Self Care | Payer: Medicaid Other | Source: Ambulatory Visit | Attending: Obstetrics & Gynecology | Admitting: Obstetrics & Gynecology

## 2018-06-28 ENCOUNTER — Encounter (HOSPITAL_COMMUNITY): Payer: Self-pay

## 2018-06-28 DIAGNOSIS — O209 Hemorrhage in early pregnancy, unspecified: Secondary | ICD-10-CM | POA: Insufficient documentation

## 2018-06-28 DIAGNOSIS — O99331 Smoking (tobacco) complicating pregnancy, first trimester: Secondary | ICD-10-CM | POA: Insufficient documentation

## 2018-06-28 DIAGNOSIS — O039 Complete or unspecified spontaneous abortion without complication: Secondary | ICD-10-CM

## 2018-06-28 DIAGNOSIS — O4691 Antepartum hemorrhage, unspecified, first trimester: Secondary | ICD-10-CM

## 2018-06-28 DIAGNOSIS — F17210 Nicotine dependence, cigarettes, uncomplicated: Secondary | ICD-10-CM | POA: Insufficient documentation

## 2018-06-28 DIAGNOSIS — Z3A01 Less than 8 weeks gestation of pregnancy: Secondary | ICD-10-CM | POA: Insufficient documentation

## 2018-06-28 DIAGNOSIS — O4690 Antepartum hemorrhage, unspecified, unspecified trimester: Secondary | ICD-10-CM

## 2018-06-28 LAB — URINALYSIS, ROUTINE W REFLEX MICROSCOPIC
Bacteria, UA: NONE SEEN
Bilirubin Urine: NEGATIVE
Glucose, UA: NEGATIVE mg/dL
Ketones, ur: 5 mg/dL — AB
Leukocytes, UA: NEGATIVE
Nitrite: NEGATIVE
PH: 5 (ref 5.0–8.0)
Protein, ur: NEGATIVE mg/dL
RBC / HPF: >50 RBC/hpf — ABNORMAL HIGH (ref 0–5)
Specific Gravity, Urine: 1.028 (ref 1.005–1.030)

## 2018-06-28 LAB — WET PREP, GENITAL
SPERM: NONE SEEN
Trich, Wet Prep: NONE SEEN
Yeast Wet Prep HPF POC: NONE SEEN

## 2018-06-28 LAB — HCG, QUANTITATIVE, PREGNANCY: hCG, Beta Chain, Quant, S: 125 m[IU]/mL — ABNORMAL HIGH (ref <5)

## 2018-06-28 NOTE — MAU Provider Note (Signed)
History     CSN: [id number]  Arrival date and time: 06/28/18 [id number]   First Provider Initiated Contact with Patient 06/28/18 2016      Chief Complaint  Patient presents with  . Vaginal Bleeding   HPI Brittany Pierce is a 27 y.o. G3P1011 at 5434w4d who presents with vaginal bleeding. She was seen on 12/20 and diagnosed with a pregnancy of unknown location and [facility]. She states she has continued to have bleeding and pass small clots. Denies any pain.  OB History    Gravida  3   Para  1   Term  1   Preterm  0   AB  1   Living  1     SAB  1   TAB  0   Ectopic  0   Multiple  0   Live Births  1           Past Medical History:  Diagnosis Date  . Depression    per prenatal  . No pertinent past medical history     Past Surgical History:  Procedure Laterality Date  . HERNIA REPAIR      Family History  Problem Relation Age of Onset  . Anesthesia problems Neg Hx   . Hypotension Neg Hx   . Malignant hyperthermia Neg Hx   . Pseudochol deficiency Neg Hx     Social History   Tobacco Use  . Smoking status: Current Every Day Smoker    Types: Cigarettes  . Smokeless tobacco: Never Used  Substance Use Topics  . Alcohol use: No  . Drug use: Yes    Types: Marijuana    Allergies:  Allergies  Allergen Reactions  . Doxycycline     Medications Prior to Admission  Medication Sig Dispense Refill Last Dose  . acetaminophen (TYLENOL) 500 MG tablet Take 500 mg by mouth every 6 (six) hours as needed.   Past Week at Unknown time  . ibuprofen (ADVIL,MOTRIN) 800 MG tablet Take 1 tablet (800 mg total) by mouth every 8 (eight) hours as needed. 30 tablet 0 Past Month at Unknown time  . Prenatal Vit-Fe Fumarate-FA (PRENATAL MULTIVITAMIN) TABS tablet Take 1 tablet by mouth daily at 12 noon.   06/28/2018 at Unknown time  . amoxicillin (AMOXIL) 500 MG tablet Take 1 tablet (500 mg total) by mouth 3 (three) times daily. (Patient not taking: Reported on  11/08/2015) 30 tablet 0   . doxycycline (VIBRA-TABS) 100 MG tablet Take 100 mg by mouth 2 (two) times daily.   More than a month at Unknown time  . famotidine (PEPCID) 20 MG tablet Take 1 tablet (20 mg total) by mouth 2 (two) times daily. 30 tablet 0   . oxyCODONE-acetaminophen (PERCOCET/ROXICET) 5-325 MG tablet Take 1-2 tablets by mouth every 4 (four) hours as needed for severe pain. 10 tablet 0   . pregabalin (LYRICA) 150 MG capsule Take 150 mg by mouth once.   11/08/2015 at Unknown time  . sucralfate (CARAFATE) 1 g tablet Take 1 tablet (1 g total) by mouth 4 (four) times daily. 30 tablet 0     Review of Systems  Constitutional: Negative.  Negative for fatigue and fever.  HENT: Negative.   Respiratory: Negative.  Negative for shortness of breath.   Cardiovascular: Negative.  Negative for chest pain.  Gastrointestinal: Negative.  Negative for abdominal pain, constipation, diarrhea, nausea and vomiting.  Genitourinary: Positive for vaginal bleeding. Negative for dysuria.  Neurological: Negative.  Negative  for dizziness and headaches.   Physical Exam   Blood pressure (!) 110/51, pulse (!) 108, temperature 98.4 F (36.9 C), resp. rate 17, height 5\' 4"  (1.626 m), weight 61.9 kg, last menstrual period 05/20/2018, SpO2 100 %, unknown if currently breastfeeding.  Physical Exam  Nursing note and vitals reviewed. Constitutional: She is oriented to person, place, and time. She appears well-developed and well-nourished. No distress.  HENT:  Head: Normocephalic.  Eyes: Pupils are equal, round, and reactive to light.  Cardiovascular: Normal rate, regular rhythm and normal heart sounds.  Respiratory: Effort normal and breath sounds normal. No respiratory distress.  GI: Soft. Bowel sounds are normal. She exhibits no distension. There is no abdominal tenderness.  Neurological: She is alert and oriented to person, place, and time.  Skin: Skin is warm and dry.  Psychiatric: She has a normal mood and  affect. Her behavior is normal. Judgment and thought content normal.    MAU Course  Procedures Results for orders placed or performed during the hospital encounter of 06/28/18 (from the past 24 hour(s))  Urinalysis, Routine w reflex microscopic     Status: Abnormal   Collection Time: 06/28/18  7:03 PM  Result Value Ref Range   Color, Urine YELLOW YELLOW   APPearance HAZY (A) CLEAR   Specific Gravity, Urine 1.028 1.005 - 1.030   pH 5.0 5.0 - 8.0   Glucose, UA NEGATIVE NEGATIVE mg/dL   Hgb urine dipstick SMALL (A) NEGATIVE   Bilirubin Urine NEGATIVE NEGATIVE   Ketones, ur 5 (A) NEGATIVE mg/dL   Protein, ur NEGATIVE NEGATIVE mg/dL   Nitrite NEGATIVE NEGATIVE   Leukocytes, UA NEGATIVE NEGATIVE   RBC / HPF >50 (H) 0 - 5 RBC/hpf   WBC, UA 0-5 0 - 5 WBC/hpf   Bacteria, UA NONE SEEN NONE SEEN   Squamous Epithelial / LPF 0-5 0 - 5   Mucus PRESENT   hCG, quantitative, pregnancy     Status: Abnormal   Collection Time: 06/28/18  7:43 PM  Result Value Ref Range   hCG, Beta Chain, Quant, S 125 (H) <5 mIU/mL  Wet prep, genital     Status: Abnormal   Collection Time: 06/28/18  8:21 PM  Result Value Ref Range   Yeast Wet Prep HPF POC NONE SEEN NONE SEEN   Trich, Wet Prep NONE SEEN NONE SEEN   Clue Cells Wet Prep HPF POC PRESENT (A) NONE SEEN   WBC, Wet Prep HPF POC FEW (A) NONE SEEN   Sperm NONE SEEN    MDM CBC, HCG AB Pos US OB Transvaginal Wet prep and gc/chlamydia  Assessment and Plan   1. Miscarriage   2. Vaginal bleeding in pregnancy    -Discharge home in stable condition -Vaginal bleeding and pain precautions discussed -Patient [facility] for repeat blood work and 2 weeks with a provider, message sent -Patient may return to MAU as needed or if her condition were to change or worsen   Rolm BookbinderCaroline M Neill CNM 06/28/2018, 8:16 PM

## 2018-06-28 NOTE — Discharge Instructions (Signed)
Miscarriage °A miscarriage is the loss of an unborn baby (fetus) before the 20th week of pregnancy. Most miscarriages happen during the first 3 months of pregnancy. Sometimes, a miscarriage can happen before a woman knows that she is pregnant. °Having a miscarriage can be an emotional experience. If you have had a miscarriage, talk with your health care provider about any questions you may have about miscarrying, the grieving process, and your plans for future pregnancy. °What are the causes? °A miscarriage may be caused by: °· Problems with the genes or chromosomes of the fetus. These problems make it impossible for the baby to develop normally. They are often the result of random errors that occur early in the development of the baby, and are not passed from parent to child (not inherited). °· Infection of the cervix or uterus. °· Conditions that affect hormone balance in the body. °· Problems with the cervix, such as the cervix opening and thinning before pregnancy is at term (cervical insufficiency). °· Problems with the uterus. These may include: °? A uterus with an abnormal shape. °? Fibroids in the uterus. °? Congenital abnormalities. These are problems that were present at birth. °· Certain medical conditions. °· Smoking, drinking alcohol, or using drugs. °· Injury (trauma). °In many cases, the cause of a miscarriage is not known. °What are the signs or symptoms? °Symptoms of this condition include: °· Vaginal bleeding or spotting, with or without cramps or pain. °· Pain or cramping in the abdomen or lower back. °· Passing fluid, tissue, or blood clots from the vagina. °How is this diagnosed? °This condition may be diagnosed based on: °· A physical exam. °· Ultrasound. °· Blood tests. °· Urine tests. °How is this treated? °Treatment for a miscarriage is sometimes not necessary if you naturally pass all the tissue that was in your uterus. If necessary, this condition may be treated with: °· Dilation and  curettage (D&C). This is a procedure in which the cervix is stretched open and the lining of the uterus (endometrium) is scraped. This is done only if tissue from the fetus or placenta remains in the body (incomplete miscarriage). °· Medicines, such as: °? Antibiotic medicine, to treat infection. °? Medicine to help the body pass any remaining tissue. °? Medicine to reduce (contract) the size of the uterus. These medicines may be given if you have a lot of bleeding. °If you have Rh negative blood and your baby was Rh positive, you will need a shot of a medicine called Rh immunoglobulinto protect your future babies from Rh blood problems. "Rh-negative" and "Rh-positive" refer to whether or not the blood has a specific protein found on the surface of red blood cells (Rh factor). °Follow these instructions at home: °Medicines ° °· Take over-the-counter and prescription medicines only as told by your health care provider. °· If you were prescribed antibiotic medicine, take it as told by your health care provider. Do not stop taking the antibiotic even if you start to feel better. °· Do not take NSAIDs, such as aspirin and ibuprofen, unless they are approved by your health care provider. These medicines can cause bleeding. °Activity °· Rest as directed. Ask your health care provider what activities are safe for you. °· Have someone help with home and family responsibilities during this time. °General instructions °· Keep track of the number of sanitary pads you use each day and how soaked (saturated) they are. Write down this information. °· Monitor the amount of tissue or blood clots that   you pass from your vagina. Save any large amounts of tissue for your health care provider to examine. °· Do not use tampons, douche, or have sex until your health care provider approves. °· To help you and your partner with the process of grieving, talk with your health care provider or seek counseling. °· When you are ready, meet with  your health care provider to discuss any important steps you should take for your health. Also, discuss steps you should take to have a healthy pregnancy in the future. °· Keep all follow-up visits as told by your health care provider. This is important. °Where to find more information °· The American Congress of Obstetricians and Gynecologists: www.acog.org °· [place]. Department of Health and Human Services Office of Women’s Health: www.womenshealth.gov °Contact a health care provider if: °· You have a fever or chills. °· You have a foul smelling vaginal discharge. °· You have more bleeding instead of less. °Get help right away if: °· You have severe cramps or pain in your back or abdomen. °· You pass blood clots or tissue from your vagina that is walnut-sized or larger. °· You soak more than 1 regular sanitary pad in an hour. °· You become light-headed or weak. °· You pass out. °· You have feelings of sadness that take over your thoughts, or you have thoughts of hurting yourself. °Summary °· Most miscarriages happen in the first 3 months of pregnancy. Sometimes miscarriage happens before a woman even knows that she is pregnant. °· Follow your health care provider's instruction for home care. Keep all follow-up appointments. °· To help you and your partner with the process of grieving, talk with your health care provider or seek counseling. °This information is not intended to replace advice given to you by your health care provider. Make sure you discuss any questions you have with your health care provider. °Document Released: 12/15/2000 Document Revised: 07/27/2016 Document Reviewed: 07/27/2016 °Elsevier Interactive Patient Education © 2019 [employer] ° ° °Coping with Pregnancy Loss °Pregnancy loss can happen any time during a pregnancy. Often the cause is not known. It is rarely because of anything you did. Pregnancy loss in early pregnancy (during the first trimester) is called a miscarriage. This type of  pregnancy loss is the most common. Pregnancy loss that happens after 20 weeks of pregnancy is called fetal demise if the baby's heart stops beating before birth. Fetal demise is much less common. Some women experience spontaneous labor shortly after fetal demise resulting in a stillborn birth (stillbirth). °Any pregnancy loss can be devastating. You will need to recover both physically and emotionally. Most women are able to get pregnant again after a pregnancy loss and deliver a healthy baby. °How to manage emotional recovery ° °Pregnancy loss is very hard emotionally. You may feel many different emotions while you grieve. You may feel sad and angry. You may also feel guilty. It is normal to have periods of crying. Emotional recovery can take longer than physical recovery. It is different for everyone. °Taking these steps can help you cope: °· Remember that it is unlikely you did anything to cause the pregnancy loss. °· Share your thoughts and feelings with friends, family, and your partner. Remember that your partner is also recovering emotionally. °· Make sure you have a good support system, and do not spend too much time alone. °· Meet with a pregnancy loss counselor or join a pregnancy loss support group. °· Get enough sleep and eat a healthy diet. Return   to regular exercise when you have recovered physically. °· Do not use drugs or alcohol to manage your emotions. °· Consider seeing a mental health professional to help you recover emotionally. °· Ask a friend or loved one to help you decide what to do with any clothing and nursery items you received for your baby. °In the case of a stillbirth, many women benefit from taking additional steps in the grieving process. You may want to: °· Hold your baby after the birth. °· Name your baby. °· Request a birth certificate. °· Create a keepsake such as handprints or footprints. °· Dress your baby and have a picture taken. °· Make funeral arrangements. °· Ask for a  baptism or blessing. °Hospitals have staff members who can help you with all these arrangements. °How to recognize emotional stress °It is normal to have emotional stress after a pregnancy loss. But emotional stress that lasts a long time or becomes severe requires treatment. Watch out for these signs of severe emotional stress: °· Sadness, anger, or guilt that is not going away and is interfering with your normal activities. °· Relationship problems that have occurred or gotten worse since the pregnancy loss. °· Signs of depression that last longer than 2 weeks. These may include: °? Sadness. °? Anxiety. °? Hopelessness. °? Loss of interest in activities you enjoy. °? Inability to concentrate. °? Trouble sleeping or sleeping too much. °? Loss of appetite or overeating. °? Thoughts of death or of hurting yourself. °Follow these instructions at home: °Medicines °· Take over-the-counter and prescription medicines only as told by your health care provider. °Activity °· Rest at home until your energy level returns. Return to your normal activities as told by your health care provider. Ask your health care provider what activities are safe for you. °General instructions °· Keep all follow-up visits as told by your health care provider. This is important. °· It may be helpful to meet with others who have experienced pregnancy loss. Ask your health care provider about support groups and resources. °· To help you and your partner with the process of grieving, talk with your health care provider or seek counseling. °· When you are ready, meet with your health care provider to discuss steps to take for a future pregnancy. °Where to find more information °· U.[place]. Department of Health and Human Services Office on Women's Health: www.womenshealth.gov °· American Pregnancy Association: www.americanpregnancy.org °Contact a health care provider if: °· You continue to experience grief, sadness, or lack of motivation for everyday  activities, and those feelings do not improve over time. °· You are struggling to recover emotionally, especially if you are using alcohol or substances to help. °Get help right away if: °· You have thoughts of hurting yourself or others. °If you ever feel like you may hurt yourself or others, or have thoughts about taking your own life, get help right away. You can go to your nearest emergency department or call: °· Your local emergency services (911 in the [place] °· A suicide crisis helpline, such as the National Suicide Prevention Lifeline at 1-800-273-8255. This is open 24 hours a day. °Summary °· Any pregnancy loss can be difficult physically and emotionally. °· You may experience many different emotions while you grieve. Emotional recovery can last longer than physical recovery. °· It is normal to have emotional stress after a pregnancy loss. But emotional stress that lasts a long time or becomes severe requires treatment. °· See your health care provider if you are struggling   emotionally after a pregnancy loss. °This information is not intended to replace advice given to you by your health care provider. Make sure you discuss any questions you have with your health care provider. °Document Released: 09/01/2017 Document Revised: 09/01/2017 Document Reviewed: 09/01/2017 °Elsevier Interactive Patient Education © 2019 [employer]. ° °

## 2018-06-28 NOTE — MAU Note (Signed)
VB with some small clots starting Friday and occurring intermittently since then with some cramping occurring mostly in her back.  Last intercourse 5-6 weeks ago.  No other discharge noted.  Hasn't taken anything for the pain.  Had an u/s on 12/20 which was too early to see anything.

## 2018-06-29 ENCOUNTER — Telehealth: Payer: Self-pay | Admitting: Obstetrics & Gynecology

## 2018-06-29 LAB — GC/CHLAMYDIA PROBE AMP (~~LOC~~) NOT AT ARMC
Chlamydia: NEGATIVE
Neisseria Gonorrhea: NEGATIVE

## 2018-06-29 LAB — HIV ANTIBODY (ROUTINE TESTING W REFLEX): HIV Screen 4th Generation wRfx: NONREACTIVE

## 2018-06-29 NOTE — Telephone Encounter (Signed)
Called patient, no answer. Left message stating to give us a call back regarding appointment information.

## 2018-07-06 ENCOUNTER — Other Ambulatory Visit: Payer: Medicaid Other

## 2018-07-06 ENCOUNTER — Other Ambulatory Visit: Payer: Self-pay | Admitting: *Deleted

## 2018-07-06 DIAGNOSIS — O039 Complete or unspecified spontaneous abortion without complication: Secondary | ICD-10-CM

## 2018-07-06 NOTE — Addendum Note (Signed)
Addended by: Duwaine Maxin on: 07/06/2018 02:27 PM   Modules accepted: Orders

## 2018-07-07 LAB — BETA HCG QUANT (REF LAB): hCG Quant: 11 m[IU]/mL

## 2018-07-13 ENCOUNTER — Encounter: Payer: Self-pay | Admitting: Student

## 2018-07-13 ENCOUNTER — Ambulatory Visit (INDEPENDENT_AMBULATORY_CARE_PROVIDER_SITE_OTHER): Payer: Self-pay | Admitting: Student

## 2018-07-13 VITALS — BP 106/72 | HR 99 | Ht 64.0 in | Wt 128.1 lb

## 2018-07-13 DIAGNOSIS — O039 Complete or unspecified spontaneous abortion without complication: Secondary | ICD-10-CM

## 2018-07-13 DIAGNOSIS — Z5189 Encounter for other specified aftercare: Secondary | ICD-10-CM

## 2018-07-13 DIAGNOSIS — R110 Nausea: Secondary | ICD-10-CM

## 2018-07-13 MED ORDER — METOCLOPRAMIDE HCL 10 MG PO TABS: 10.0000 mg | ORAL_TABLET | Freq: Four times a day (QID) | ORAL | 0 refills | Status: DC

## 2018-07-13 MED ORDER — ONDANSETRON 4 MG PO TBDP: 4.0000 mg | ORAL_TABLET | Freq: Three times a day (TID) | ORAL | 0 refills | Status: DC | PRN

## 2018-07-13 NOTE — Patient Instructions (Signed)
Miscarriage  A miscarriage is the loss of an unborn baby (fetus) before the 20th week of pregnancy. Most miscarriages happen during the first 3 months of pregnancy. Sometimes, a miscarriage can happen before a woman knows that she is pregnant.  Having a miscarriage can be an emotional experience. If you have had a miscarriage, talk with your health care provider about any questions you may have about miscarrying, the grieving process, and your plans for future pregnancy.  What are the causes?  A miscarriage may be caused by:  · Problems with the genes or chromosomes of the fetus. These problems make it impossible for the baby to develop normally. They are often the result of random errors that occur early in the development of the baby, and are not passed from parent to child (not inherited).  · Infection of the cervix or uterus.  · Conditions that affect hormone balance in the body.  · Problems with the cervix, such as the cervix opening and thinning before pregnancy is at term (cervical insufficiency).  · Problems with the uterus. These may include:  ? A uterus with an abnormal shape.  ? Fibroids in the uterus.  ? Congenital abnormalities. These are problems that were present at birth.  · Certain medical conditions.  · Smoking, drinking alcohol, or using drugs.  · Injury (trauma).  In many cases, the cause of a miscarriage is not known.  What are the signs or symptoms?  Symptoms of this condition include:  · Vaginal bleeding or spotting, with or without cramps or pain.  · Pain or cramping in the abdomen or lower back.  · Passing fluid, tissue, or blood clots from the vagina.  How is this diagnosed?  This condition may be diagnosed based on:  · A physical exam.  · Ultrasound.  · Blood tests.  · Urine tests.  How is this treated?  Treatment for a miscarriage is sometimes not necessary if you naturally pass all the tissue that was in your uterus. If necessary, this condition may be treated with:  · Dilation and  curettage (D&C). This is a procedure in which the cervix is stretched open and the lining of the uterus (endometrium) is scraped. This is done only if tissue from the fetus or placenta remains in the body (incomplete miscarriage).  · Medicines, such as:  ? Antibiotic medicine, to treat infection.  ? Medicine to help the body pass any remaining tissue.  ? Medicine to reduce (contract) the size of the uterus. These medicines may be given if you have a lot of bleeding.  If you have Rh negative blood and your baby was Rh positive, you will need a shot of a medicine called Rh immunoglobulinto protect your future babies from Rh blood problems. "Rh-negative" and "Rh-positive" refer to whether or not the blood has a specific protein found on the surface of red blood cells (Rh factor).  Follow these instructions at home:  Medicines    · Take over-the-counter and prescription medicines only as told by your health care provider.  · If you were prescribed antibiotic medicine, take it as told by your health care provider. Do not stop taking the antibiotic even if you start to feel better.  · Do not take NSAIDs, such as aspirin and ibuprofen, unless they are approved by your health care provider. These medicines can cause bleeding.  Activity  · Rest as directed. Ask your health care provider what activities are safe for you.  · Have someone   help with home and family responsibilities during this time.  General instructions  · Keep track of the number of sanitary pads you use each day and how soaked (saturated) they are. Write down this information.  · Monitor the amount of tissue or blood clots that you pass from your vagina. Save any large amounts of tissue for your health care provider to examine.  · Do not use tampons, douche, or have sex until your health care provider approves.  · To help you and your partner with the process of grieving, talk with your health care provider or seek counseling.  · When you are ready, meet with  your health care provider to discuss any important steps you should take for your health. Also, discuss steps you should take to have a healthy pregnancy in the future.  · Keep all follow-up visits as told by your health care provider. This is important.  Where to find more information  · The American Congress of Obstetricians and Gynecologists: www.acog.org  · U.S. Department of Health and Human Services Office of Women’s Health: www.womenshealth.gov  Contact a health care provider if:  · You have a fever or chills.  · You have a foul smelling vaginal discharge.  · You have more bleeding instead of less.  Get help right away if:  · You have severe cramps or pain in your back or abdomen.  · You pass blood clots or tissue from your vagina that is walnut-sized or larger.  · You soak more than 1 regular sanitary pad in an hour.  · You become light-headed or weak.  · You pass out.  · You have feelings of sadness that take over your thoughts, or you have thoughts of hurting yourself.  Summary  · Most miscarriages happen in the first 3 months of pregnancy. Sometimes miscarriage happens before a woman even knows that she is pregnant.  · Follow your health care provider's instruction for home care. Keep all follow-up appointments.  · To help you and your partner with the process of grieving, talk with your health care provider or seek counseling.  This information is not intended to replace advice given to you by your health care provider. Make sure you discuss any questions you have with your health care provider.  Document Released: 12/15/2000 Document Revised: 07/27/2016 Document Reviewed: 07/27/2016  Elsevier Interactive Patient Education © 2019 Elsevier Inc.

## 2018-07-13 NOTE — Progress Notes (Signed)
History:  Ms. Brittany Pierce is a 28 y.o. [phone] who presents to clinic today for miscarriage follow up. Diagnosed with early SAB on 12/28 after a significant drop in HCG. HCG last week down to 11.  Denies abdominal pain or vaginal bleeding at this time. This is her second miscarriage. She has an 28 year old son. Current pregnancy was with father of her son. This was a desired pregnancy. She is not interested in contraception. Hx of depression, currently not on medications. Does not currently see anyone for management of her symptoms.    There are no active problems to display for this patient.   Allergies  Allergen Reactions  . Doxycycline     Current Outpatient Medications on File Prior to Visit  Medication Sig Dispense Refill  . acetaminophen (TYLENOL) 500 MG tablet Take 500 mg by mouth every 6 (six) hours as needed.    . doxycycline (VIBRA-TABS) 100 MG tablet Take 100 mg by mouth 2 (two) times daily.    Marland Kitchen. ibuprofen (ADVIL,MOTRIN) 800 MG tablet Take 1 tablet (800 mg total) by mouth every 8 (eight) hours as needed. (Patient not taking: Reported on 07/13/2018) 30 tablet 0  . Prenatal Vit-Fe Fumarate-FA (PRENATAL MULTIVITAMIN) TABS tablet Take 1 tablet by mouth daily at 12 noon.    . sucralfate (CARAFATE) 1 g tablet Take 1 tablet (1 g total) by mouth 4 (four) times daily. 30 tablet 0   No current facility-administered medications on file prior to visit.      The following portions of the patient's history were reviewed and updated as appropriate: allergies, current medications, family history, past medical history, social history, past surgical history and problem list.  Review of Systems:  Other than those mentioned in HPI all ROS negative   Objective:  Physical Exam BP 106/72   Pulse 99   Ht 5\' 4"  (1.626 m)   Wt 128 lb 1.6 oz (58.1 kg)   LMP 05/20/2018   Breastfeeding Unknown   BMI 21.99 kg/m  CONSTITUTIONAL: Well-developed, well-nourished female in no acute distress.  HEAD:  Normocephalic, atraumatic RESPIRATORY: Effort normal, no problems with respiration noted. GENITOURINARY: Not evaluated PSYCHIATRIC: Normal mood and affect. Normal behavior. Normal judgment and thought content. Pt appropriately tearful considering recent pregnancy loss.     Labs and Imaging Declines lab work today  Assessment & Plan:  1. Follow-up visit after miscarriage -HCG down to 11 today. Will defer labs today -will have patient f/u with Brittany Pierce d/t depression -last pap in 2015 -- pt to schedule f/u for pap smear  2. Nausea -antiemetic prescribed for monthly premenstrual nausea - metoCLOPramide (REGLAN) 10 MG tablet; Take 1 tablet (10 mg total) by mouth every 6 (six) hours.  Dispense: 30 tablet; Refill: 0 - ondansetron (ZOFRAN ODT) 4 MG disintegrating tablet; Take 1 tablet (4 mg total) by mouth every 8 (eight) hours as needed for nausea or vomiting.  Dispense: 15 tablet; Refill: 0  Judeth HornLawrence, Keshan Reha, NP 07/13/2018 6:04 PM

## 2018-07-14 NOTE — BH Specialist Note (Deleted)
Pt not seen today

## 2018-07-17 ENCOUNTER — Institutional Professional Consult (permissible substitution): Payer: Medicaid Other

## 2018-08-16 ENCOUNTER — Ambulatory Visit: Payer: Medicaid Other | Admitting: Student

## 2019-02-14 ENCOUNTER — Other Ambulatory Visit: Payer: Self-pay

## 2019-02-14 ENCOUNTER — Emergency Department (HOSPITAL_BASED_OUTPATIENT_CLINIC_OR_DEPARTMENT_OTHER)
Admission: EM | Admit: 2019-02-14 | Discharge: 2019-02-14 | Disposition: A | Discharge status: Home / Self Care | Payer: Medicaid Other | Attending: Emergency Medicine | Admitting: Emergency Medicine

## 2019-02-14 ENCOUNTER — Encounter (HOSPITAL_BASED_OUTPATIENT_CLINIC_OR_DEPARTMENT_OTHER): Payer: Self-pay

## 2019-02-14 DIAGNOSIS — J3489 Other specified disorders of nose and nasal sinuses: Secondary | ICD-10-CM

## 2019-02-14 DIAGNOSIS — F17210 Nicotine dependence, cigarettes, uncomplicated: Secondary | ICD-10-CM | POA: Insufficient documentation

## 2019-02-14 DIAGNOSIS — F1210 Cannabis abuse, uncomplicated: Secondary | ICD-10-CM | POA: Insufficient documentation

## 2019-02-14 NOTE — Discharge Instructions (Signed)
You were seen in the ED today for pain to the bridge of your nose Please take Ibuprofen 400 mg every 4-6 hours as needed for the pain You can apply heat if it reduces the pain, otherwise I would recommend ice  Follow up with [facility] for primary care needs

## 2019-02-14 NOTE — ED Provider Notes (Signed)
[facility] EMERGENCY DEPARTMENT Provider Note   CSN: [id number] Arrival date & time: 02/14/19  1735    History   Chief Complaint Chief Complaint  Patient presents with  . Facial Swelling    HPI Brittany Pierce is a 28 y.o. female who presents to the ED today complaining of gradual onset, constant, achy, pain to the bridge of her nose x 2 days. No known trauma. Pt reports she noticed some swelling to her nose and then started having pain. She has not taken anything for her symptoms. Denies fever, chills, drainage, congestion, sore throat, ear pain, or any other associated symptoms.        Past Medical History:  Diagnosis Date  . Depression    per prenatal  . No pertinent past medical history     There are no active problems to display for this patient.   Past Surgical History:  Procedure Laterality Date  . CESAREAN SECTION    . HERNIA REPAIR       OB History    Gravida  3   Para  1   Term  1   Preterm  0   AB  2   Living  1     SAB  1   TAB  0   Ectopic  0   Multiple  0   Live Births  1            Home Medications    Prior to Admission medications   Medication Sig Start Date End Date Taking? Authorizing Provider  acetaminophen (TYLENOL) 500 MG tablet Take 500 mg by mouth every 6 (six) hours as needed.    [provider]  doxycycline (VIBRA-TABS) 100 MG tablet Take 100 mg by mouth 2 (two) times daily.    [provider]  ibuprofen (ADVIL,MOTRIN) 800 MG tablet Take 1 tablet (800 mg total) by mouth every 8 (eight) hours as needed. Patient not taking: Reported on 07/13/2018 12/13/14   Beers, Charmayne Sheerharles M, PA-C  metoCLOPramide (REGLAN) 10 MG tablet Take 1 tablet (10 mg total) by mouth every 6 (six) hours. 07/13/18   Judeth HornLawrence, Erin, NP  ondansetron (ZOFRAN ODT) 4 MG disintegrating tablet Take 1 tablet (4 mg total) by mouth every 8 (eight) hours as needed for nausea or vomiting. 07/13/18   Judeth HornLawrence, Erin, NP  Prenatal Vit-Fe  Fumarate-FA (PRENATAL MULTIVITAMIN) TABS tablet Take 1 tablet by mouth daily at 12 noon.    [provider]  sucralfate (CARAFATE) 1 g tablet Take 1 tablet (1 g total) by mouth 4 (four) times daily. 11/08/15   Lorre NickAllen, Anthony, MD    Family History Family History  Problem Relation Age of Onset  . Anesthesia problems Neg Hx   . Hypotension Neg Hx   . Malignant hyperthermia Neg Hx   . Pseudochol deficiency Neg Hx     Social History Social History   Tobacco Use  . Smoking status: Current Every Day Smoker    Packs/day: 0.25    Types: Cigarettes  . Smokeless tobacco: Never Used  Substance Use Topics  . Alcohol use: No  . Drug use: Yes    Types: Marijuana     Allergies   Doxycycline   Review of Systems Review of Systems  Constitutional: Negative for chills and fever.  HENT: Negative for congestion, dental problem, ear pain and sore throat.        + pain to bridge of nose     Physical Exam Updated Vital Signs BP  124/69 (BP Location: Left Arm)   Pulse (!) 102   Resp 18   Ht 5\' 4"  (1.626 m)   Wt 57.6 kg   LMP 01/16/2019   SpO2 100%   BMI 21.80 kg/m   Physical Exam Vitals signs and nursing note reviewed.  Constitutional:      Appearance: She is not ill-appearing.  HENT:     Head: Normocephalic and atraumatic.     Right Ear: Tympanic membrane normal.     Left Ear: Tympanic membrane normal.     Nose: Nose normal.     Comments: No obvious swelling noted to bridge of nose, no ecchymosis, no deformity. Pt does have exquisite tenderness with even light touch, no overlying skin changes    Mouth/Throat:     Mouth: Mucous membranes are moist.     Pharynx: No oropharyngeal exudate or posterior oropharyngeal erythema.  Eyes:     Conjunctiva/sclera: Conjunctivae normal.  Cardiovascular:     Rate and Rhythm: Normal rate and regular rhythm.  Pulmonary:     Effort: Pulmonary effort is normal.     Breath sounds: Normal breath sounds.  Skin:    General: Skin is warm  and dry.     Coloration: Skin is not jaundiced.  Neurological:     Mental Status: She is alert.      ED Treatments / Results  Labs (all labs ordered are listed, but only abnormal results are displayed) Labs Reviewed - No data to display  EKG None  Radiology No results found.  Procedures Procedures (including critical care time)  Medications Ordered in ED Medications - No data to display   Initial Impression / Assessment and Plan / ED Course  I have reviewed the triage vital signs and the nursing notes.  Pertinent labs & imaging results that were available during my care of the patient were reviewed by me and considered in my medical decision making (see chart for details).    28 year old female who presents with pain to the nasal bridge x 2 days, no trauma. No obvious deformity or ecchymosis on exam. Pt does have TTP to bridge of nose, no overlying skin changes to suggest cellulitis. No maxillary or frontal sinus pressure. No fevers or chills. Do not think this is related to sinus infection today. Advised pt to take Ibuprofen PRN for pain and to apply ice to the area - she states ice made it worse yesterday, can do heat instead. Will give pt referral to [facility] for primary care needs.       Final Clinical Impressions(s) / ED Diagnoses   Final diagnoses:  Nasal pain    ED Discharge Orders    None       Eustaquio Maize, PA-C 02/14/19 2302    Fredia Sorrow, MD 02/17/19 [phone]

## 2019-02-14 NOTE — ED Triage Notes (Signed)
Pt c/o pain and swelling to "bridge of nose"- day 2-denies injury-NAD-steady gait

## 2020-01-16 ENCOUNTER — Other Ambulatory Visit: Payer: Self-pay | Admitting: *Deleted

## 2020-01-16 DIAGNOSIS — N6310 Unspecified lump in the right breast, unspecified quadrant: Secondary | ICD-10-CM

## 2020-01-22 ENCOUNTER — Ambulatory Visit: Payer: Self-pay | Admitting: *Deleted

## 2020-01-22 ENCOUNTER — Other Ambulatory Visit: Payer: Self-pay

## 2020-01-22 VITALS — BP 98/68 | Temp 98.0°F | Wt 125.0 lb

## 2020-01-22 DIAGNOSIS — Z01419 Encounter for gynecological examination (general) (routine) without abnormal findings: Secondary | ICD-10-CM

## 2020-01-22 DIAGNOSIS — N6310 Unspecified lump in the right breast, unspecified quadrant: Secondary | ICD-10-CM

## 2020-01-22 DIAGNOSIS — N6452 Nipple discharge: Secondary | ICD-10-CM

## 2020-01-22 NOTE — Progress Notes (Signed)
Brittany Pierce is a 29 y.o. [phone] female who presents to [facility] clinic today with complaint of a right breast lump that she first noticed 2-3 years ago. Complaints of bilateral clear nipple discharge when expressed x 2 weeks.    Pap Smear: Pap smear completed today. Last Pap smear was 02/23/2011 at [facility] clinic and was normal. Per patient has no history of an abnormal Pap smear. Last Pap smear result is available in Epic.   Physical exam: Breasts Right breast slightly larger than left breast that per patient is normal for her. No skin abnormalities bilateral breasts. No nipple retraction bilateral breasts. No nipple discharge bilateral breasts. Unable to express any nipple discharge on exam. No lymphadenopathy. No lumps palpated left breast. Palpated a bb sized mobile lump within the right breast around 12 o'clock. No complaints of pain or tenderness on exam.    Pelvic/Bimanual Ext Genitalia No lesions, no swelling and no discharge observed on external genitalia.        Vagina Vagina pink and normal texture. No lesions or discharge observed in vagina.        Cervix Cervix is present. Cervix pink and of normal texture. No discharge observed.    Uterus Uterus is present and palpable. Uterus in normal position and normal size.        Adnexae Bilateral ovaries present and palpable. No tenderness on palpation.         Rectovaginal No rectal exam completed today since patient had no rectal complaints. No skin abnormalities observed on exam.     Smoking History: Patient is a current smoker. Discussed smoking cessation with patient. Referred to the  quit line and gave resources to the free smoking cessation classes at [facility].    Patient Navigation: Patient education provided. Access to services provided for patient through [facility] program.  Breast and Cervical Cancer Risk Assessment: Patient does not have family history of breast cancer, known genetic mutations, or  radiation treatment to the chest before age 70. Patient does not have history of cervical dysplasia, immunocompromised, or DES exposure in-utero. Breast Cancer risk assessment completed. No breast cancer risk calculated due to patient is less than 69 years old.  A: [facility] exam with pap smear  P: Referred patient to the [facility] for a bilateral breast ultrasound. Appointment scheduled Thursday, January 24, 2020 at 0920.  Priscille Heidelberg, RN 01/22/2020 1:53 PM

## 2020-01-22 NOTE — Patient Instructions (Signed)
Explained breast self awareness with Brittany Pierce. Let patient know [facility] will cover Pap smears every 3 years unless has a history of abnormal Pap smears. Referred patient to the [facility] for a bilateral breast ultrasound. Appointment scheduled Thursday, January 24, 2020 at 0920. Patient aware of appointment and will be there. Let patient know will follow up with her within the next couple weeks with results of her Pap smear by letter or phone. Brittany Pierce verbalized understanding.  Brittany Pierce, Brittany Maser, RN 1:53 PM

## 2020-01-23 LAB — CYTOLOGY - PAP
Adequacy: ABSENT
Diagnosis: NEGATIVE

## 2020-01-24 ENCOUNTER — Ambulatory Visit
Admission: RE | Admit: 2020-01-24 | Discharge: 2020-01-24 | Disposition: A | Discharge status: Home / Self Care | Payer: No Typology Code available for payment source | Source: Ambulatory Visit | Attending: Obstetrics and Gynecology | Admitting: Obstetrics and Gynecology

## 2020-01-24 ENCOUNTER — Other Ambulatory Visit: Payer: Self-pay

## 2020-01-24 ENCOUNTER — Telehealth: Payer: Self-pay

## 2020-01-24 DIAGNOSIS — N6310 Unspecified lump in the right breast, unspecified quadrant: Secondary | ICD-10-CM

## 2020-01-24 NOTE — Telephone Encounter (Addendum)
Normal Pap/HPV letter mailed to patient.   02/07/2021-Pap results showed a shift in flora suggestive of bacterial vaginosis. Per Fonnie Mu, RN, I contacted patient to see if she has any symptoms. Patient states she only has the white discharge, no itching. Patient informed rx Metronidazole (Flagyl) to be sent to pharmacy. [facility] in [place], [place].

## 2020-02-11 ENCOUNTER — Telehealth: Payer: Self-pay

## 2020-02-11 MED ORDER — METRONIDAZOLE 500 MG PO TABS: 500.0000 mg | ORAL_TABLET | Freq: Two times a day (BID) | ORAL | 0 refills | Status: DC

## 2020-02-11 NOTE — Telephone Encounter (Addendum)
Rx sent to [facility]  ----- Message from Priscille Heidelberg, RN sent at 02/08/2020 12:05 PM EDT ----- Regarding: FW: Pap result See below.  Thanks, Wynona Canes ----- Message ----- From: Tereso Newcomer, MD Sent: 02/08/2020  11:54 AM EDT To: Priscille Heidelberg, RN Subject: RE: Pap result                                 Flagyl is okay. Even with breastfeeding. ----- Message ----- From: Priscille Heidelberg, RN Sent: 02/08/2020   9:34 AM EDT To: Tereso Newcomer, MD, Fidel Levy Kerly Rigsbee, LPN Subject: RE: Pap result                                 Hello Dr. Macon Large,  See below and confirm this is okay.  Thanks, Christine ----- Message ----- From: Narda Rutherford, LPN Sent: 10/06/9673   9:24 AM EDT To: Priscille Heidelberg, RN Subject: FW: Pap result                                 When I was putting in the rx orders for patient. A warning about lactation status is unknown, and can not take Flagyl if breastfeeding. I verified with patient that she is not breastfeeding. Can I override the warning and send rx? ----- Message ----- From: Priscille Heidelberg, RN Sent: 02/08/2020   7:45 AM EDT To: Fidel Levy Hitesh Fouche, LPN Subject: Pap result                                     Good Morning Finnley Lewis,  I noticed the attached patients Pap smear result showed a shift in flora suggestive of BV. Did you check if she is having any symptoms?   Thanks, [employer]

## 2020-02-11 NOTE — Addendum Note (Signed)
Addended by: Narda Rutherford on: 02/11/2020 09:51 AM   Modules accepted: Orders

## 2020-08-19 ENCOUNTER — Other Ambulatory Visit: Payer: Self-pay

## 2020-08-19 ENCOUNTER — Ambulatory Visit (INDEPENDENT_AMBULATORY_CARE_PROVIDER_SITE_OTHER): Payer: No Payment, Other | Admitting: Licensed Clinical Social Worker

## 2020-08-19 ENCOUNTER — Encounter (HOSPITAL_COMMUNITY): Payer: Self-pay | Admitting: Licensed Clinical Social Worker

## 2020-08-19 DIAGNOSIS — F331 Major depressive disorder, recurrent, moderate: Secondary | ICD-10-CM | POA: Insufficient documentation

## 2020-08-19 DIAGNOSIS — F5082 Avoidant/restrictive food intake disorder: Secondary | ICD-10-CM
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
## 2021-02-05 ENCOUNTER — Ambulatory Visit: Payer: No Typology Code available for payment source | Admitting: Family Medicine

## 2021-03-25 ENCOUNTER — Ambulatory Visit (INDEPENDENT_AMBULATORY_CARE_PROVIDER_SITE_OTHER): Payer: Self-pay | Admitting: Family Medicine

## 2021-03-25 ENCOUNTER — Other Ambulatory Visit (HOSPITAL_COMMUNITY)
Admission: RE | Admit: 2021-03-25 | Discharge: 2021-03-25 | Disposition: A | Discharge status: Home / Self Care | Payer: Medicaid Other | Source: Ambulatory Visit | Attending: Family Medicine | Admitting: Family Medicine

## 2021-03-25 ENCOUNTER — Other Ambulatory Visit: Payer: Self-pay

## 2021-03-25 VITALS — BP 105/59 | HR 77 | Wt 177.0 lb

## 2021-03-25 DIAGNOSIS — N63 Unspecified lump in unspecified breast: Secondary | ICD-10-CM

## 2021-03-25 DIAGNOSIS — N898 Other specified noninflammatory disorders of vagina: Secondary | ICD-10-CM

## 2021-03-25 MED ORDER — BORIC ACID VAGINAL 600 MG VA SUPP: 1.0000 | Freq: Every day | VAGINAL | 0 refills | Status: AC

## 2021-03-25 NOTE — Progress Notes (Signed)
Patient reports vaginal discharge along with "BV smell". Patient strongly believes that she has bacterial vaginosis and would like Rx to be sent to pharmacy. She feels as though a "screening swab" would be unnecessary because "she knows she has BV" Brittany Pierce would also like to start Depo provera birth control and have a manual breast exam.    Dawayne Patricia, CMA   03/25/21

## 2021-03-25 NOTE — Progress Notes (Signed)
Opened in error

## 2021-03-25 NOTE — Progress Notes (Signed)
   GYNECOLOGY OFFICE VISIT NOTE  History:  30 y.o. [id number] here today for concern about breast lump and vaginal discharge. She denies any abnormal  bleeding, pelvic pain or other concerns.  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 - last diagnosis was sometime in the past few months - has fishy odor this time - thinks has had more than three infections in past six months - usually does pills for treatment - interested in trialing boric acid for suppression - open to doing self swab today   #Breast lump - reports felt lump of right axilla/upper right breast - previously had cyst on [place] of right breast but around 11 o'clock area - denies pain - denies any nipple discharge  The following portions of the patient's history were reviewed and updated as appropriate: allergies, current medications, past family history, past medical history, past social history, past surgical history and problem list.   Health Maintenance:  Normal pap and negative HRHPV on 01/2020.  Review of Systems:  Pertinent items noted in HPI Review of Systems  Constitutional:  Negative for fever.  Genitourinary:  Negative for dysuria, frequency and urgency.       + vaginal discharge   Objective:  Physical Exam BP (!) 105/59   Pulse 77   Wt 177 lb (80.3 kg)   LMP 03/02/2021 (Approximate)   Breastfeeding No   BMI 30.38 kg/m  Physical Exam Vitals and nursing note reviewed. Exam conducted with a chaperone present.  Constitutional:      Appearance: Normal appearance.  HENT:     Head: Normocephalic.  Cardiovascular:     Rate and Rhythm: Normal rate.  Chest:       Comments: Some increased density in right upper area of right breast, mobile Genitourinary:    Comments: Deferred pelvic exam Skin:    General: Skin is warm.     Capillary Refill: Capillary refill takes less than 2 seconds.  Neurological:     General: No focal deficit present.     Mental Status: She is alert.     Assessment & Plan:   1. Breast lump  Right breast with increased density in upper right. Previously with hx of cyst in separate (11 o'clock area) of right breast.  - Breast US ordered  2. Vaginal discharge 3. Hx of recurrent BV Patient with increased watery discharge with fishy odor. Per preference pelvic exam deferred. Reports multiple BV infection in past. Unclear regarding specifics of how many infections in past year but subjectively has had numerous. Discussed options of treatment for recurrent BV and patient would like to trial boric acid - Vaginitis swab sent and will treat per results as appropriate - Vaginal boric acid sent to patient's pharmacy  4. STI screening GC/CT swab sent. Defers HIV and RPR testing at this time.    Routine preventative health maintenance measures emphasized. Please refer to After Visit Summary for other counseling recommendations.    Total face-to-face time with patient: 30 minutes.  Over 50% of encounter was spent on counseling and coordination of care.  Warner Mccreedy, MD, MPH OB Fellow, Faculty [facility] for [facility], [facility]

## 2021-03-26 LAB — CERVICOVAGINAL ANCILLARY ONLY
Bacterial Vaginitis (gardnerella): POSITIVE — AB
Candida Glabrata: NEGATIVE
Candida Vaginitis: NEGATIVE
Chlamydia: NEGATIVE
Comment: NEGATIVE
Comment: NEGATIVE
Comment: NEGATIVE
Comment: NEGATIVE
Comment: NEGATIVE
Comment: NORMAL
Neisseria Gonorrhea: NEGATIVE
Trichomonas: NEGATIVE

## 2021-03-27 ENCOUNTER — Other Ambulatory Visit: Payer: Self-pay | Admitting: Family Medicine

## 2021-03-27 MED ORDER — METRONIDAZOLE 500 MG PO TABS: 500.0000 mg | ORAL_TABLET | Freq: Two times a day (BID) | ORAL | 0 refills | Status: AC

## 2021-05-11 ENCOUNTER — Emergency Department (HOSPITAL_BASED_OUTPATIENT_CLINIC_OR_DEPARTMENT_OTHER)
Admission: EM | Admit: 2021-05-11 | Discharge: 2021-05-11 | Disposition: A | Discharge status: Home / Self Care | Payer: No Typology Code available for payment source | Attending: Emergency Medicine | Admitting: Emergency Medicine

## 2021-05-11 ENCOUNTER — Other Ambulatory Visit: Payer: Self-pay

## 2021-05-11 ENCOUNTER — Encounter (HOSPITAL_BASED_OUTPATIENT_CLINIC_OR_DEPARTMENT_OTHER): Payer: Self-pay | Admitting: Emergency Medicine

## 2021-05-11 DIAGNOSIS — F17210 Nicotine dependence, cigarettes, uncomplicated: Secondary | ICD-10-CM | POA: Insufficient documentation

## 2021-05-11 DIAGNOSIS — J111 Influenza due to unidentified influenza virus with other respiratory manifestations: Secondary | ICD-10-CM

## 2021-05-11 DIAGNOSIS — R197 Diarrhea, unspecified: Secondary | ICD-10-CM | POA: Insufficient documentation

## 2021-05-11 DIAGNOSIS — R1110 Vomiting, unspecified: Secondary | ICD-10-CM | POA: Insufficient documentation

## 2021-05-11 DIAGNOSIS — M791 Myalgia, unspecified site: Secondary | ICD-10-CM | POA: Insufficient documentation

## 2021-05-11 DIAGNOSIS — R059 Cough, unspecified: Secondary | ICD-10-CM | POA: Insufficient documentation

## 2021-05-11 DIAGNOSIS — R300 Dysuria: Secondary | ICD-10-CM | POA: Insufficient documentation

## 2021-05-11 LAB — URINALYSIS, ROUTINE W REFLEX MICROSCOPIC
Bilirubin Urine: NEGATIVE
Glucose, UA: NEGATIVE mg/dL
Hgb urine dipstick: NEGATIVE
Ketones, ur: NEGATIVE mg/dL
Leukocytes,Ua: NEGATIVE
Nitrite: NEGATIVE
Protein, ur: NEGATIVE mg/dL
Specific Gravity, Urine: 1.015 (ref 1.005–1.030)
pH: 5.5 (ref 5.0–8.0)

## 2021-05-11 LAB — PREGNANCY, URINE: Preg Test, Ur: NEGATIVE

## 2021-05-11 MED ORDER — IBUPROFEN 400 MG PO TABS
600.0000 mg | ORAL_TABLET | Freq: Once | ORAL | Status: AC
  Administered 2021-05-11: 600 mg via ORAL
  Filled 2021-05-11: qty 1

## 2021-05-11 MED ORDER — BENZONATATE 100 MG PO CAPS: 100.0000 mg | ORAL_CAPSULE | Freq: Three times a day (TID) | ORAL | 0 refills | Status: DC | PRN

## 2021-05-11 MED ORDER — PROMETHAZINE HCL 25 MG PO TABS: 25.0000 mg | ORAL_TABLET | Freq: Four times a day (QID) | ORAL | 0 refills | Status: DC | PRN

## 2021-05-11 MED ORDER — ONDANSETRON 4 MG PO TBDP
4.0000 mg | ORAL_TABLET | Freq: Once | ORAL | Status: AC
  Administered 2021-05-11: 4 mg via ORAL
  Filled 2021-05-11: qty 1

## 2021-05-11 MED ORDER — BENZONATATE 100 MG PO CAPS
100.0000 mg | ORAL_CAPSULE | Freq: Once | ORAL | Status: AC
  Administered 2021-05-11: 100 mg via ORAL
  Filled 2021-05-11: qty 1

## 2021-05-11 NOTE — ED Notes (Signed)
Pt made this RN aware that she was diagnosed with BV. Per notes on 9/21. Was able to get medication due to high cost. Dr. Madilyn Hook informed untreated BV via secure chat.

## 2021-05-11 NOTE — ED Notes (Signed)
Pt tolerating fluids.   

## 2021-05-11 NOTE — ED Provider Notes (Signed)
[facility] EMERGENCY DEPARTMENT Provider Note   CSN: [id number] Arrival date & time: 05/11/21  0310     History Chief Complaint  Patient presents with   Emesis    Brittany Pierce is a 30 y.o. female.  The history is provided by the patient.  Emesis Brittany Pierce is a 30 y.o. female who presents to the Emergency Department complaining of vomiting.  She presents to the ED for evaluation of vomiting.  She got sick on Wednesday with sore throat and cough, felt sick.  Had a fever two days ago, possibly today.  Vomited once yesterday and today.  She has taken mucinex but vomited it up.  Has diarrhea that started tonight.  Has associated DOE.  Has mild suprapubic discomfort and mild dysuria.    Son was sick 10/23 with URI.  No chance of pregnancy.      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    Past Surgical History:  Procedure Laterality Date   CESAREAN SECTION     HERNIA REPAIR       OB History     Gravida  3   Para  1   Term  1   Preterm  0   AB  2   Living  1      SAB  1   IAB  0   Ectopic  0   Multiple  0   Live Births  1           Family History  Problem Relation Age of Onset   Hypertension Mother    Asthma Father    Asthma Brother    Hypertension Maternal Grandmother    Glaucoma Maternal Grandfather    Anesthesia problems Neg Hx    Hypotension Neg Hx    Malignant hyperthermia Neg Hx    Pseudochol deficiency Neg Hx     Social History   Tobacco Use   Smoking status: Every Day    Packs/day: 0.25    Types: Cigarettes   Smokeless tobacco: Never  Vaping Use   Vaping Use: Never used  Substance Use Topics   Alcohol use: No   Drug use: Yes    Types:  Marijuana    Comment: everyday 2 to 3 blunts per day.     Home Medications Prior to Admission medications   Medication Sig Start Date End Date Taking? Authorizing Provider  acetaminophen (TYLENOL) 500 MG tablet Take 500 mg by mouth every 6 (six) hours as needed. Patient not taking: No sig reported    [provider]  doxycycline (VIBRA-TABS) 100 MG tablet Take 100 mg by mouth 2 (two) times daily. Patient not taking: No sig reported    [provider]  ibuprofen (ADVIL,MOTRIN) 800 MG tablet Take 1 tablet (800 mg total) by mouth every 8 (eight) hours as needed. Patient not taking: No sig reported 12/13/14   Beers, Charmayne Sheer, PA-C  metoCLOPramide (REGLAN) 10 MG tablet Take 1 tablet (10 mg total) by mouth every 6 (six) hours. Patient not taking: No sig reported 07/13/18   Judeth Horn, NP  ondansetron (ZOFRAN ODT) 4 MG disintegrating tablet Take 1 tablet (4 mg total) by mouth every 8 (eight) hours  as needed for nausea or vomiting. Patient not taking: No sig reported 07/13/18   Judeth Horn, NP  Prenatal Vit-Fe Fumarate-FA (PRENATAL MULTIVITAMIN) TABS tablet Take 1 tablet by mouth daily at 12 noon. Patient not taking: No sig reported    [provider]  sucralfate (CARAFATE) 1 g tablet Take 1 tablet (1 g total) by mouth 4 (four) times daily. Patient not taking: No sig reported 11/08/15   Lorre Nick, MD    Allergies    Doxycycline  Review of Systems   Review of Systems  Gastrointestinal:  Positive for vomiting.  All other systems reviewed and are negative.  Physical Exam Updated Vital Signs BP (!) 92/50   Pulse 92   Ht 5\' 4"  (1.626 m)   Wt 53.1 kg   LMP 04/28/2021   SpO2 100%   BMI 20.08 kg/m   Physical Exam Vitals and nursing note reviewed.  Constitutional:      Appearance: She is well-developed.  HENT:     Head: Normocephalic and atraumatic.  Cardiovascular:     Rate and Rhythm: Normal rate and regular rhythm.     Heart sounds: No murmur  heard. Pulmonary:     Effort: Pulmonary effort is normal. No respiratory distress.     Breath sounds: Normal breath sounds.  Abdominal:     Palpations: Abdomen is soft.     Tenderness: There is no abdominal tenderness. There is no guarding or rebound.  Musculoskeletal:        General: No tenderness.  Skin:    General: Skin is warm and dry.  Neurological:     Mental Status: She is alert and oriented to person, place, and time.  Psychiatric:        Behavior: Behavior normal.    ED Results / Procedures / Treatments   Labs (all labs ordered are listed, but only abnormal results are displayed) Labs Reviewed - No data to display  EKG None  Radiology No results found.  Procedures Procedures   Medications Ordered in ED Medications - No data to display  ED Course  I have reviewed the triage vital signs and the nursing notes.  Pertinent labs & imaging results that were available during my care of the patient were reviewed by me and considered in my medical decision making (see chart for details).    MDM Rules/Calculators/A&P                          patient here for evaluation of cough, body aches, vomiting and diarrhea. She is non-toxic appearing on evaluation and well hydrated. She does have mild dysuria. UA is not consistent with UTI. Presentation is consistent with acute viral illness, possible influenza. Presentation is not consistent with sepsis, dehydration, acute abdomen. Serious bacterial infection. She had frequent coughing on ED presentation, improved after medications. Discussed with patient home care for viral URI with oral fluid hydration as well as over-the-counter medications as needed for symptom management. Discussed outpatient follow-up and return precautions.  Final Clinical Impression(s) / ED Diagnoses Final diagnoses:  None    Rx / DC Orders ED Discharge Orders     None        04/30/2021, MD 05/11/21 [phone]

## 2021-05-11 NOTE — ED Triage Notes (Signed)
Pt states she has been "sick" for 3-4 days with cough, subjective fever, body aches. "I think one of my cysts on my ovaries are swollen from me being sick" states she is [facility] on exertion. Pt endorses 1 episode of nonbloody emesis per day for the last two days. Pt says she has been taking theraflu and mucinex for her sx.

## 2021-05-19 ENCOUNTER — Encounter: Payer: Self-pay | Admitting: Family Medicine

## 2021-05-19 ENCOUNTER — Ambulatory Visit (INDEPENDENT_AMBULATORY_CARE_PROVIDER_SITE_OTHER): Payer: Self-pay | Admitting: Family Medicine

## 2021-05-19 ENCOUNTER — Other Ambulatory Visit: Payer: Self-pay

## 2021-05-19 VITALS — BP 112/62 | HR 98 | Wt 123.6 lb

## 2021-05-19 DIAGNOSIS — N63 Unspecified lump in unspecified breast: Secondary | ICD-10-CM

## 2021-05-19 DIAGNOSIS — F5082 Avoidant/restrictive food intake disorder: Secondary | ICD-10-CM

## 2021-05-19 DIAGNOSIS — F419 Anxiety disorder, unspecified: Secondary | ICD-10-CM

## 2021-05-19 DIAGNOSIS — Z309 Encounter for contraceptive management, unspecified: Secondary | ICD-10-CM

## 2021-05-19 DIAGNOSIS — Z3202 Encounter for pregnancy test, result negative: Secondary | ICD-10-CM

## 2021-05-19 MED ORDER — NORETHINDRONE 0.35 MG PO TABS: 1.0000 | ORAL_TABLET | Freq: Every day | ORAL | 3 refills | Status: AC

## 2021-05-19 NOTE — Progress Notes (Signed)
GYNECOLOGY OFFICE VISIT NOTE  History:   Brittany Pierce is a 30 y.o. [phone] here today for contraception management. She denies any abnormal vaginal discharge, bleeding, pelvic pain or other concerns.   She requests birth control for two reasons today: contraception and to gain weight. She reports "I have an eating disorder." Restricts her diet with minimally eating solid foods. Largely gets her calories from liquids. States that whenever she eats solid foods, she feels like her stomach will bloat and then she has the perception that she is larger in size than she is. She previously was seen at [facility] [facility] for evaluation (see note 08/2020), but has never followed with anyone long term. She works as an International aid/development worker at [employer] and doesn't feel like she has the time to meet with someone in person. Would be interested in virtual options.   Currently smoker, about 8 cigs/day. Tried Depo injections in the past, but extremely nervous about needles. Last sexually active in spring/early summer. Has monthly cycles, last about 4-5 days.     Past Medical History:  Diagnosis Date   Anemia    Depression    per prenatal   No pertinent past medical history     Past Surgical History:  Procedure Laterality Date   CESAREAN SECTION     HERNIA REPAIR      The following portions of the patient's history were reviewed and updated as appropriate: allergies, current medications, past family history, past medical history, past social history, past surgical history and problem list.   Health Maintenance:  Normal pap on 01/2020.    Review of Systems:  Pertinent items noted in HPI and remainder of comprehensive ROS otherwise negative.  Physical Exam:  BP 112/62   Pulse 98   Wt 123 lb 9.6 oz (56.1 kg)   LMP 04/28/2021   BMI 21.22 kg/m  CONSTITUTIONAL: Well-developed, well-nourished female in no acute distress.  HEENT:  Normocephalic, atraumatic.  No scleral icterus.  NECK: Normal  range of motion SKIN: No rash noted. Not diaphoretic.  MUSCULOSKELETAL: No edema noted. NEUROLOGIC: Alert and oriented to person, place, and time. PSYCHIATRIC: Normal mood and affect. Normal behavior. Normal judgment and thought content. CARDIOVASCULAR: Normal heart rate noted RESPIRATORY: Effort normal, no problems with respiration noted ABDOMEN: non-distended   PELVIC: Deferred  U preg negative.   Labs and Imaging No results found for this or any previous visit (from the past 168 hour(s)). No results found.    Assessment and Plan:  1. Encounter for contraceptive management, unspecified type Discussed options, will avoid estrogen w/ current tobacco use. She would like to try pills, she understands that micronor isn't associated with significant weight gain but can have some.  - norethindrone (MICRONOR) 0.35 MG tablet; Take 1 tablet (0.35 mg total) by mouth daily.  Dispense: 28 tablet; Refill: 3  2. Avoidant-restrictive food intake disorder (ARFID) Extensively discussed with patient that this should be addressed more in depth and seeking medications to increase weight gain are only masking the underlying concern. Placed referral to behavioral health. Additionally, will touch base with one of our [facility] Dieticians to assess if they have any local resources that provide virtual evaluation/limited insurance. Offered labwork today, however pt declined d/t cost.   3. Anxiety and depression - Ambulatory referral to Integrated Behavioral Health  4. Mass of breast, unspecified laterality Evaluated by Dr. Ephriam Jenkins on last visit, rescheduled her breast US for her today.    Routine preventative health maintenance measures emphasized. Please refer  to After Visit Summary for other counseling recommendations.   Follow up as needed (discussed alternative form of contraception if she isn't able to remember taking pills).   Leticia Penna, DO  OB Fellow, Faculty [facility], Center for  [facility] 05/19/2021 10:30 AM

## 2021-05-19 NOTE — Progress Notes (Signed)
Patient stated that she would like to get on birth control in order for her to "gain weight"   PHQ9/GAD7 scores were addressed and patient agreed to behavior health therapy, referral has been put in.    [place], CMA

## 2021-05-19 NOTE — Patient Instructions (Addendum)
Wonderful to see you today.   I will send a message to one of our local dieticians to discuss any providers you can see.   You must take the progestin only pills around the same time everyday (within an hour).    24- Hour Availability:   [facility]  [address] [place], [place] [phone] Crisis [phone]  [facility] the [facility] [phone]  [place] [facility]  [phone]   [facility]  [phone] (after hours)  Therapeutic Alternative/Mobile Crisis   [phone]  [place] National Suicide Hotline  787-066-0957 Len Childs)  Call 911 or go to emergency room  [facility]  [phone]);  [place] and [employer]  [phone]); [place], [place], [place], [place], [place], [place], [place]

## 2021-05-20 LAB — POCT PREGNANCY, URINE: Preg Test, Ur: NEGATIVE

## 2021-05-21 ENCOUNTER — Ambulatory Visit: Payer: Self-pay | Admitting: Clinical

## 2021-05-21 DIAGNOSIS — F419 Anxiety disorder, unspecified: Secondary | ICD-10-CM

## 2021-05-21 DIAGNOSIS — F5082 Avoidant/restrictive food intake disorder: Secondary | ICD-10-CM

## 2021-05-21 NOTE — BH Specialist Note (Signed)
Integrated Behavioral Health via Telemedicine Visit with Intern  05/21/2021 Brittany Pierce [id number]  Number of Integrated Behavioral Health visits: 1 Session Start time: 8:15  Session End time: 8:50 am Total time: 35   Referring Provider: Leticia Penna, DO Patient/Family location: Home  [facility] [place] [facility] Provider location: [facility]  All persons participating in visit: Patient Types of Service: Individual psychotherapy  I connected with Brittany Pierce and/or Brittany Pierce's patient via  Psychologist, clinical  (Video is Surveyor, mining) and verified that I am speaking with the correct person using two identifiers. Discussed confidentiality: Yes   I discussed the limitations of telemedicine and the availability of in person appointments.  Discussed there is a possibility of technology failure and discussed alternative modes of communication if that failure occurs.  I discussed that engaging in this telemedicine visit, they consent to the provision of behavioral healthcare and the services will be billed under their insurance.  Patient and/or legal guardian expressed understanding and consented to Telemedicine visit: Yes   Presenting Concerns: Patient and/or family reports the following symptoms/concerns: Depression and eating disorder Duration of problem: 5/6 years; Severity of problem: moderate  Patient and/or Family's Strengths/Protective Factors: Concrete supports in place (healthy food, safe environments, etc.)  Goals Addressed: Patient will:  Reduce symptoms of: depression   Increase knowledge and/or ability of: coping skills and healthy habits   Demonstrate ability to: Increase healthy adjustment to current life circumstances and Decrease self-medicating behaviors  Progress towards Goals: Ongoing  Interventions: Interventions utilized:  CBT Cognitive Behavioral Therapy Standardized Assessments completed: Not Needed  Patient and/or  Family Response: Patient was receptive of goals set in place today.  Assessment: Patient currently experiencing depression due to holidays coming up, also is dealing with a previously diagnosed eating disorder. She present body dysmorphia symptoms, Intern will continue to assess.   Patient may benefit from continued assessment for body dysmorphia, self-medicating reduction, and .  Plan: Follow up with behavioral health clinician on : 06/04/21 Behavioral recommendations: continued individual psychotherapy and possible referral in the future. Referral(s):  Patient was told about the [facility]  I discussed the assessment and treatment plan with the patient and/or parent/guardian. They were provided an opportunity to ask questions and all were answered. They agreed with the plan and demonstrated an understanding of the instructions.   They were advised to call back or seek an in-person evaluation if the symptoms worsen or if the condition fails to improve as anticipated.  [name]

## 2021-05-27 ENCOUNTER — Telehealth: Payer: Self-pay

## 2021-05-27 ENCOUNTER — Other Ambulatory Visit: Payer: Self-pay | Admitting: Family Medicine

## 2021-05-27 ENCOUNTER — Telehealth: Payer: Self-pay | Admitting: Family Medicine

## 2021-05-27 DIAGNOSIS — N63 Unspecified lump in unspecified breast: Secondary | ICD-10-CM

## 2021-05-27 NOTE — Telephone Encounter (Signed)
Pt concern addressed in another encounter.   Addison Naegeli, RN  05/27/21

## 2021-05-27 NOTE — Telephone Encounter (Signed)
Report given to me by Erie Noe with the front office. Called pt. Pt states she is at [facility] and needs referral placed. Explained that our office recommended breast US. Front office notified to fax [place] order and provider notes from recent visit. Explained to pt I am unsure if [facility] will be able to complete this for her, she can follow up with our office as needed.

## 2021-05-27 NOTE — Telephone Encounter (Signed)
Called patient and verified that I am speaking with the correct person using two identifiers.  Called Brittany Pierce to inform her that breast ultrasound/mammogram has been scheduled for December 28, 22 @ 10:30am. The imaging will be done at [facility]. Patient had questions about logging onto her MyChart account. Username was quoted and password was changed by patient. There were no further questions or concerns.   Dawayne Patricia, CMA   05/27/21

## 2021-05-27 NOTE — Telephone Encounter (Signed)
Patient need a Referral for [facility], patient is there now

## 2021-06-02 ENCOUNTER — Other Ambulatory Visit: Payer: Self-pay | Admitting: Family Medicine

## 2021-06-02 DIAGNOSIS — F5082 Avoidant/restrictive food intake disorder: Secondary | ICD-10-CM

## 2021-06-02 NOTE — Progress Notes (Signed)
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 Message sent to patient.   Allayne Stack, DO

## 2021-06-04 ENCOUNTER — Ambulatory Visit: Payer: Self-pay | Admitting: Clinical

## 2021-06-04 DIAGNOSIS — F5082 Avoidant/restrictive food intake disorder: Secondary | ICD-10-CM

## 2021-06-04 DIAGNOSIS — F32A Depression, unspecified: Secondary | ICD-10-CM

## 2021-06-04 DIAGNOSIS — F419 Anxiety disorder, unspecified: Secondary | ICD-10-CM

## 2021-06-04 NOTE — BH Specialist Note (Signed)
Integrated Behavioral Health via Telemedicine Visit  06/04/2021 Brittany Pierce [id number]  Number of Integrated Behavioral Health visits: 2 Session Start time: 9:48 am  Session End time: 10:21 am Total time: 30  Referring Provider: Leticia Penna, DO Patient/Family location: Home [facility] Provider location: [facility] All persons participating in visit: Patient Types of Service: Individual psychotherapy  I connected with [name] and/or [place] Hoog's patient via  Psychologist, clinical  (Video is Surveyor, mining) and verified that I am speaking with the correct person using two identifiers. Discussed confidentiality: Yes   I discussed the limitations of telemedicine and the availability of in person appointments.  Discussed there is a possibility of technology failure and discussed alternative modes of communication if that failure occurs.  I discussed that engaging in this telemedicine visit, they consent to the provision of behavioral healthcare and the services will be billed under their insurance.  Patient and/or legal guardian expressed understanding and consented to Telemedicine visit: Yes   Presenting Concerns: Patient and/or family reports the following symptoms/concerns: Depression and eating disorder Duration of problem: 5/6 years; Severity of problem: moderate  Patient and/or Family's Strengths/Protective Factors: Sense of purpose  Goals Addressed: Patient will:  Reduce symptoms of: depression   Increase knowledge and/or ability of: coping skills and self-management skills   Demonstrate ability to: Increase healthy adjustment to current life circumstances and Increase motivation to adhere to plan of care  Progress towards Goals: Ongoing  Interventions: Interventions utilized:  CBT Cognitive Behavioral Therapy Standardized Assessments completed: PHQ 9   Flowsheet Row Integrated Behavioral Health from 06/04/2021 in  [facility]  PHQ-9 Total Score 21       Patient and/or Family Response: Patient responded positively to the goals that we have set in place.  Assessment: Patient currently experiencing depression due to holidays and recent family issues.   Patient may benefit from referral to therapist that specializes in eating disorders.  Plan: Follow up with behavioral health clinician on : 06/10/2021 Behavioral recommendations: continued individual psychotherapy Referral(s):  Therapist who specializes in eating disorders: Daun Peacock or Noni Saupe, [place]  I discussed the assessment and treatment plan with the patient and/or parent/guardian. They were provided an opportunity to ask questions and all were answered. They agreed with the plan and demonstrated an understanding of the instructions.   They were advised to call back or seek an in-person evaluation if the symptoms worsen or if the condition fails to improve as anticipated.  [name]

## 2021-06-04 NOTE — Patient Instructions (Addendum)
Center for [facility] [address] [place], [place] [address] [phone] (main office) [phone] (Jamie's office)     Local Therapists for Eating Disorders Monica Becton Young     Noni Saupe, MSW, LCSW [facility]              [address]    [address] [place], [place]     [place], [place] [phone]      [phone]   Eating Disorder Clinics/Residential Treatment [address] of [place] Fill out application on line or Environmental health practitioner office [phone] [email or web address] Residential program for young women dealing with life-controlling issues, generally a 6 month program. Locations in [place], [place]; [place], [place]; [place]. [place], [place]; [place], [place] and one underway in [place], [place]  [place] [phone] Locations in [place] and [place] Residential program specializing in the treatment of eating disorders and OCD  [employer] [place], [place] [phone] or [phone]    24/7 Behavioral Health Crisis Centers by [place]:  [place]:  [place]: [facility] 24/7 Crisis Line: [phone] Access to Care Line [phone]  [facility]: [facility] 24/7 Crisis Line: [phone] Access to Care Line [phone] [facility]: Center for Excellence in [facility] Mental Health Outpatient: [phone]   [address] [place], [place] [address]  [facility]: [facility] Services (24/7): [phone]   [address] [place], [place] [address]  [facility]: [facility] (24/7): [phone]   [address] [place], [place] [address] - 24 Hour Crisis Line: [phone]  [place]: - [facility] (24/7): [phone]   [address] [place], [place] [address] - [place] 24 Hour Crisis Line: [phone]  [facility] (24/7): [phone] [address] [place]. [place], [place] [address] - 24 Hour Crisis Line: [phone]  [facility]:  [facility] Behavioral Health Urgent Care (24/7): [phone]   [address] [place], [place] [address] - 24 Hour Crisis Line: [phone]  [place]:  [place]: -24 Hour Crisis Line: [phone] Or Emergency room at [address] [place], [place] [address]  -Dawna Part [facility] [phone] Non-emergency walk-in Monday-Fridays 8:30am-3:00pm, At [address] [place], [place] [address]  [facility] Health Services: [employer] [place]: [phone]  [address], [place], [place] [address] -Crisis Line: Business Hours- [phone] / After Hours-[phone]  [facility]: [employer]: [phone] [address] in [place], [place] [address] -Crisis Line: Business Hours- [phone] After Hours-[phone]  [facility]: [employer]: [phone]  [address] in [place], [place], [place] [address] -Crisis Line: Business Hours- [phone] / After Hours-[phone]

## 2021-06-10 ENCOUNTER — Ambulatory Visit: Payer: Self-pay | Admitting: Clinical

## 2021-06-10 DIAGNOSIS — F32A Depression, unspecified: Secondary | ICD-10-CM

## 2021-06-10 DIAGNOSIS — F5082 Avoidant/restrictive food intake disorder: Secondary | ICD-10-CM

## 2021-06-10 NOTE — BH Specialist Note (Signed)
**Note Brittany-Identified via Obfuscation** Integrated Behavioral Health via Telemedicine Visit  06/10/2021 Hawley Pavia [id number]  Number of Integrated Behavioral Health visits: 2  Patient missed appointment with intern. Intern sent a mychart message asking the patient to call and reschedule.   Brittany Pierce

## 2021-07-01 ENCOUNTER — Other Ambulatory Visit: Payer: No Typology Code available for payment source

## 2021-07-02 ENCOUNTER — Other Ambulatory Visit: Payer: Self-pay

## 2021-07-02 DIAGNOSIS — N6310 Unspecified lump in the right breast, unspecified quadrant: Secondary | ICD-10-CM

## 2021-08-04 ENCOUNTER — Ambulatory Visit
Admission: RE | Admit: 2021-08-04 | Discharge: 2021-08-04 | Disposition: A | Discharge status: Home / Self Care | Payer: No Typology Code available for payment source | Source: Ambulatory Visit | Attending: Obstetrics and Gynecology | Admitting: Obstetrics and Gynecology

## 2021-08-04 ENCOUNTER — Other Ambulatory Visit: Payer: Self-pay

## 2021-08-04 ENCOUNTER — Ambulatory Visit: Payer: Self-pay | Admitting: *Deleted

## 2021-08-04 VITALS — BP 96/60 | Wt 127.6 lb

## 2021-08-04 DIAGNOSIS — N6311 Unspecified lump in the right breast, upper outer quadrant: Secondary | ICD-10-CM

## 2021-08-04 DIAGNOSIS — Z1239 Encounter for other screening for malignant neoplasm of breast: Secondary | ICD-10-CM

## 2021-08-04 DIAGNOSIS — N6310 Unspecified lump in the right breast, unspecified quadrant: Secondary | ICD-10-CM

## 2021-08-04 DIAGNOSIS — N644 Mastodynia: Secondary | ICD-10-CM

## 2021-08-04 NOTE — Progress Notes (Signed)
Ms. Brittany Pierce is a 31 y.o. female who presents to [facility] clinic today with complaint of a new right outer breast lump for over 6 months. Patient complained of left axillary pain x 2-3 days that is a sharp pain. Patient rates the pain at a 4-5 out of 10.   Pap Smear: Pap smear not completed today. Last Pap smear was 01/22/2020 at [facility] clinic and was normal. Per patient has no history of an abnormal Pap smear. Last Pap smear result is available in Epic.   Physical exam: Breasts Right breast slightly larger than left breast that per patient is normal for her. Noted on previous exam 01/22/2020. No skin abnormalities bilateral breasts. No nipple retraction bilateral breasts. No nipple discharge bilateral breasts. No lymphadenopathy. No lumps palpated left breast. Palpated a pea sized lump within the right breast at 11 o'clock 10 cm from the nipple. Complaints of left upper outer and axillary tenderness on exam.  Pelvic/Bimanual Pap is not indicated today per BCCCP guidelines.   Smoking History: Patient is a current smoker. Discussed smoking cessation with patient. Referred to the [place] quit line.   Patient Navigation: Patient education provided. Access to services provided for patient through BCCCP program.   Breast and Cervical Cancer Risk Assessment: Patient does not have family history of breast cancer, known genetic mutations, or radiation treatment to the chest before age 49. Patient does not have history of cervical dysplasia, immunocompromised, or DES exposure in-utero. Breast Cancer risk assessment completed. No breast cancer risk calculated due to patient is less than 76 years old.  Risk Assessment     Risk Scores       08/04/2021 01/22/2020   Last edited by: Narda Rutherford, LPN Meryl Dare, CMA   5-year risk:     Lifetime risk:              A: BCCCP exam without pap smear Complaint of right breast lump and left axillary pain.  P: Referred [facility] for a diagnostic mammogram. Appointment scheduled Tuesday, August 04, 2021 at 1240.  Priscille Heidelberg, RN 08/04/2021 11:27 AM

## 2021-08-04 NOTE — Patient Instructions (Signed)
Explained breast self awareness with Thalia Bloodgood. Patient did not need a Pap smear today due to last Pap smear was 01/22/2020. Let her know BCCCP will cover Pap smears every 3 years unless has a history of abnormal Pap smears. Referred patient to the [facility] for a diagnostic mammogram. Appointment scheduled Tuesday, August 04, 2021 at 1240. Patient aware of appointment and will be there. Brittany Pierce verbalized understanding.  Relda Agosto, Arvil Ch[place], RN 12:11 PM

## 2021-10-21 ENCOUNTER — Telehealth: Payer: Self-pay | Admitting: Clinical

## 2021-10-21 ENCOUNTER — Ambulatory Visit: Payer: Self-pay

## 2021-10-21 NOTE — Telephone Encounter (Signed)
BH Intern left HIPPA-compliant message to call back Jasmine from [employer] for [employer] at [employer] for [facility] at  [phone] [facility] office).   ?

## 2022-02-24 IMAGING — MG DIGITAL DIAGNOSTIC BILAT W/ TOMO W/ CAD
8 of 14 series · 8 of 40 positions shown · non-contrast
Comparison: None.

CLINICAL DATA: Patient describes a palpable lump within the
upper-outer quadrant of the RIGHT breast. This is patient's first
mammogram.

EXAM:
DIGITAL DIAGNOSTIC BILATERAL MAMMOGRAM WITH TOMOSYNTHESIS AND CAD;
ULTRASOUND RIGHT BREAST LIMITED
TECHNIQUE: Bilateral digital diagnostic mammography and breast tomosynthesis
was performed. The images were evaluated with computer-aided
detection.; Targeted ultrasound examination of the right breast was
performed

[L CC synth-2D]
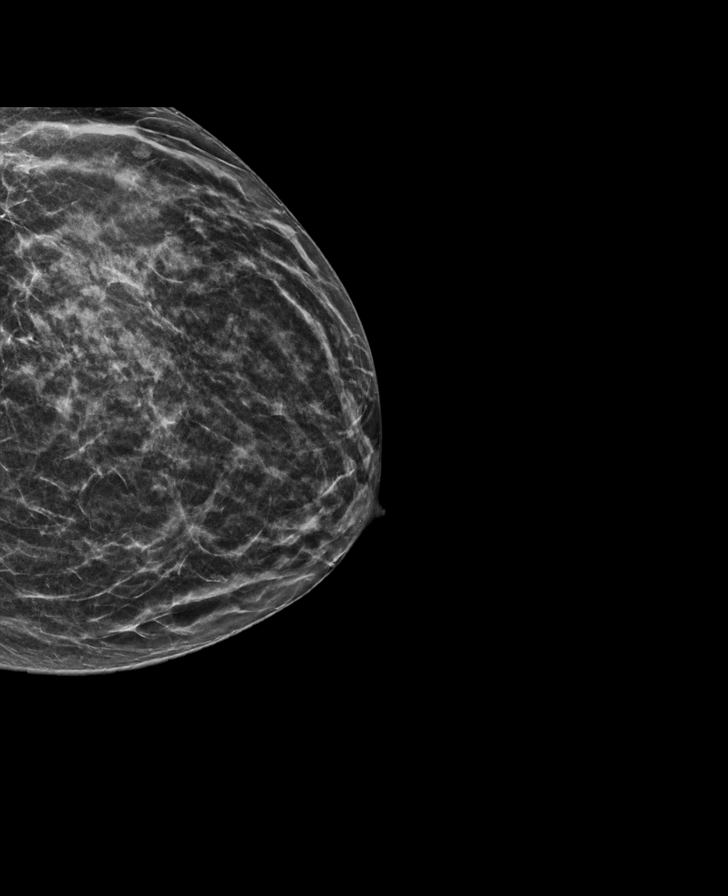

[R MLO synth-2D (1 of 3)]
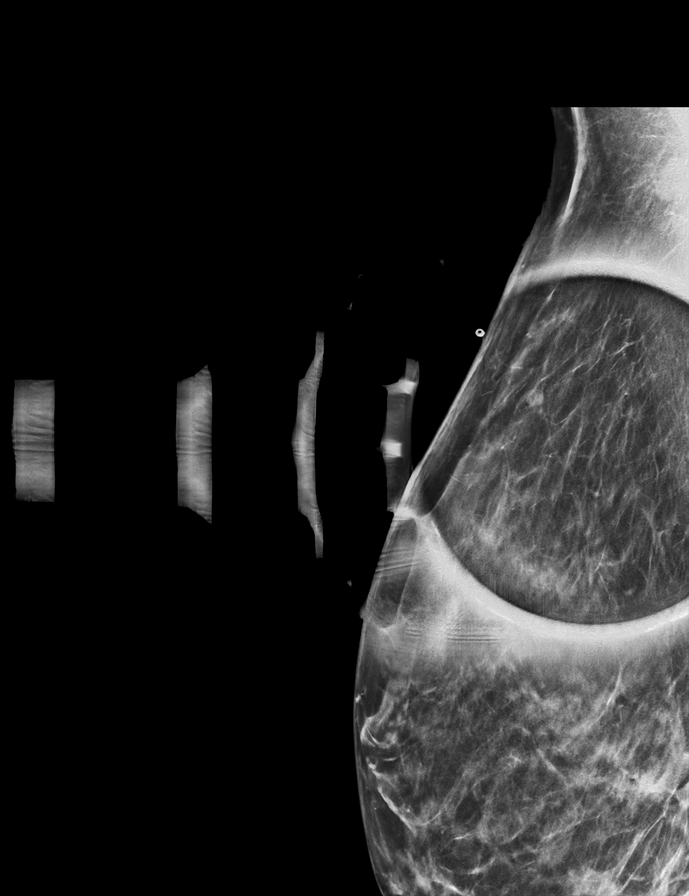

[R CC synth-2D (1 of 2)]
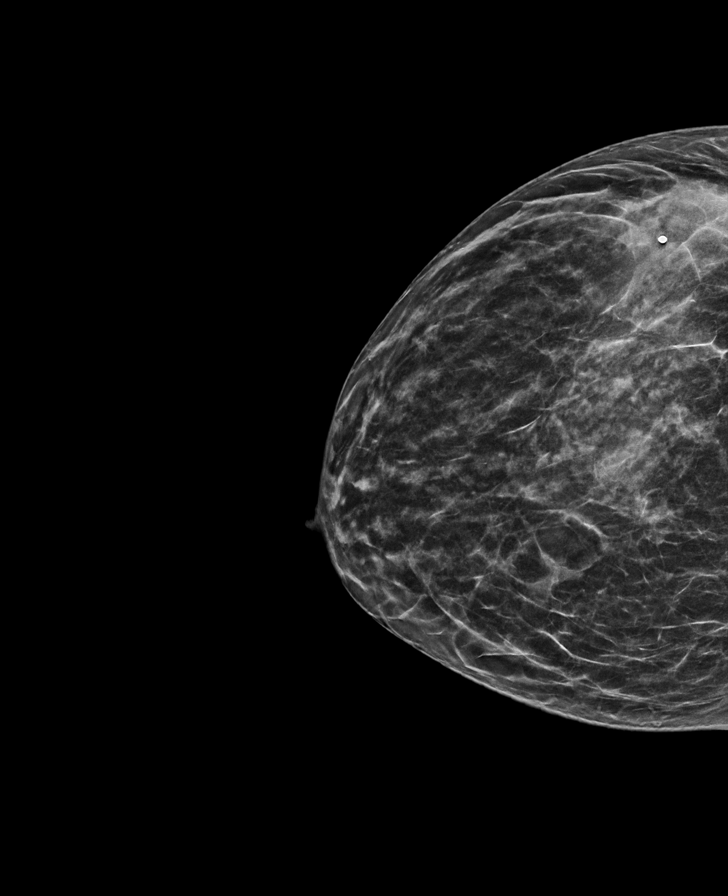

[L MLO synth-2D]
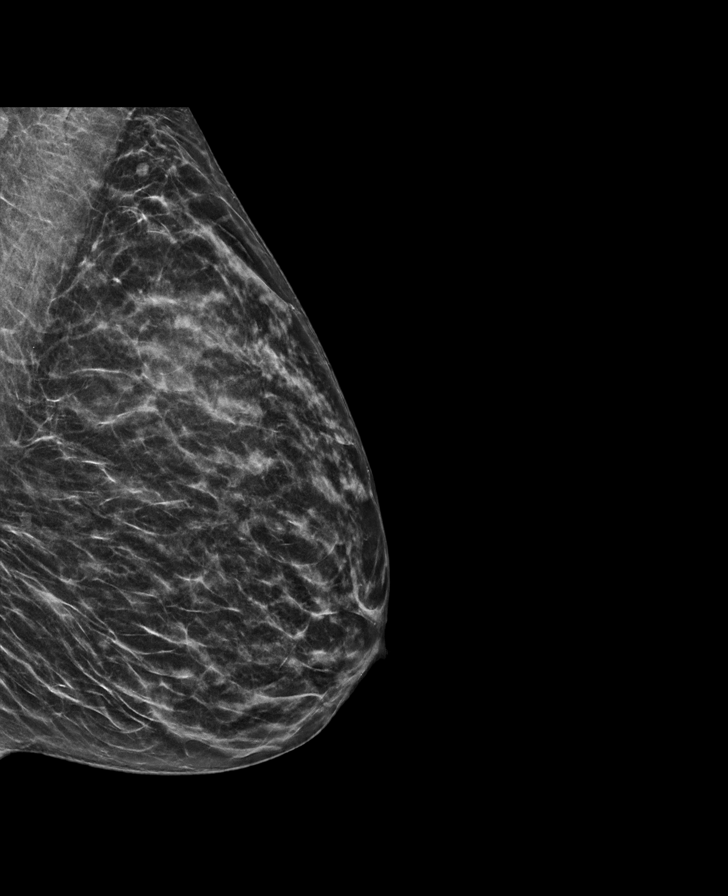

[R MLO synth-2D (2 of 3)]
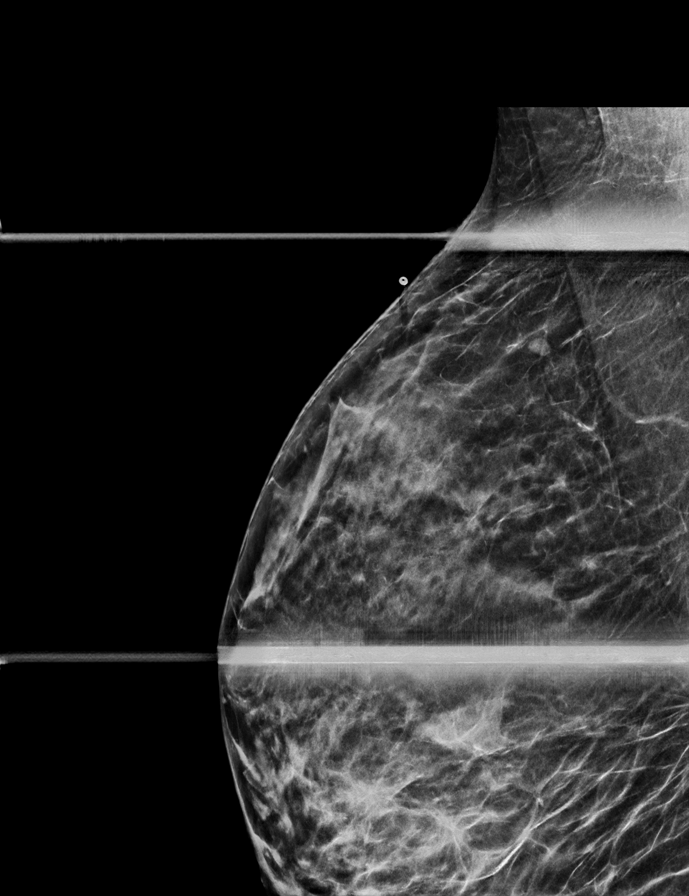

[R MLO synth-2D (3 of 3)]
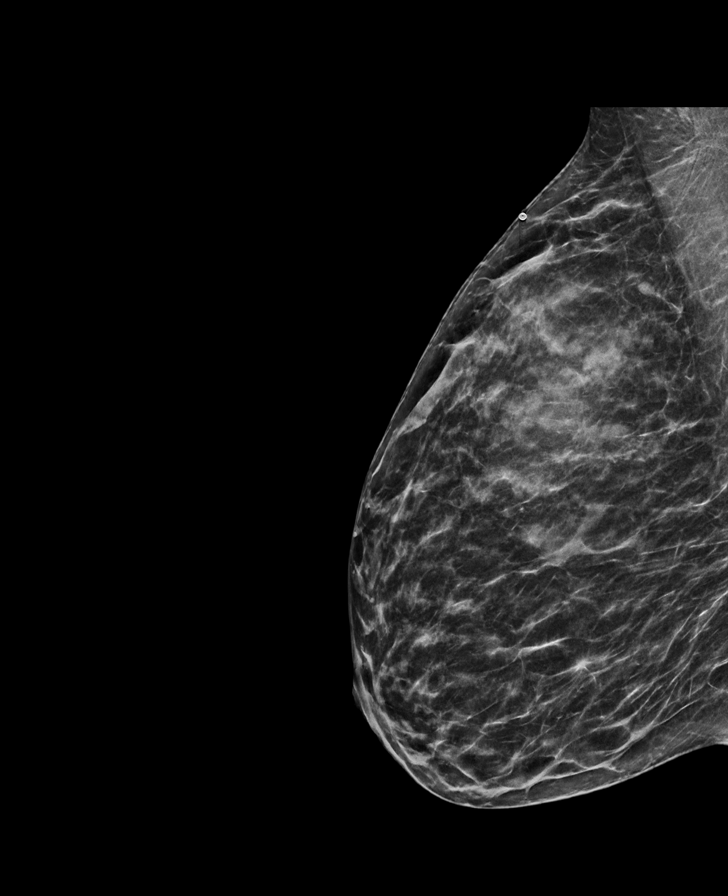

[R CC synth-2D (2 of 2)]
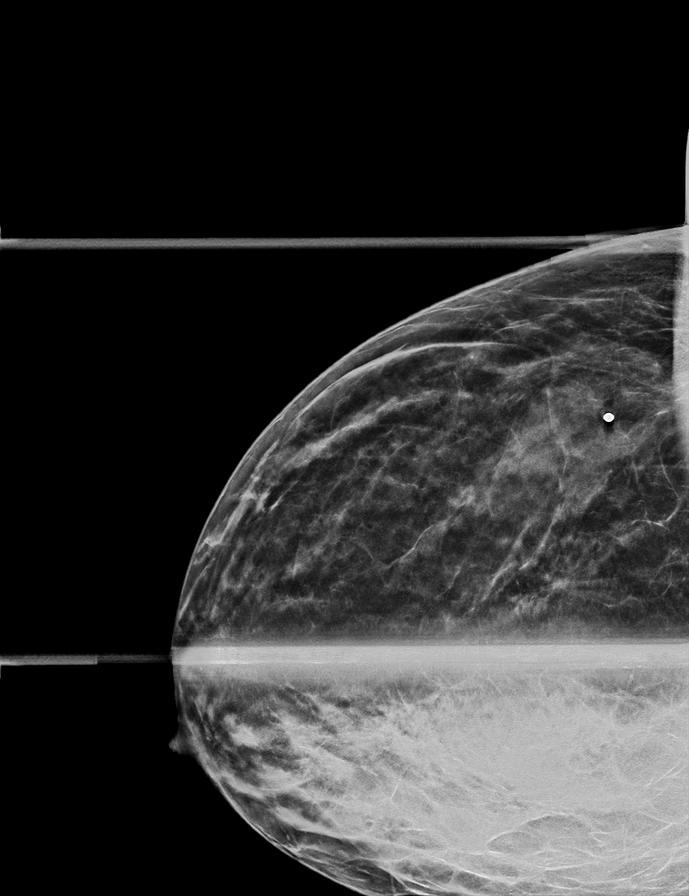

[L MLO tomo · tomo slice 25/49.0]
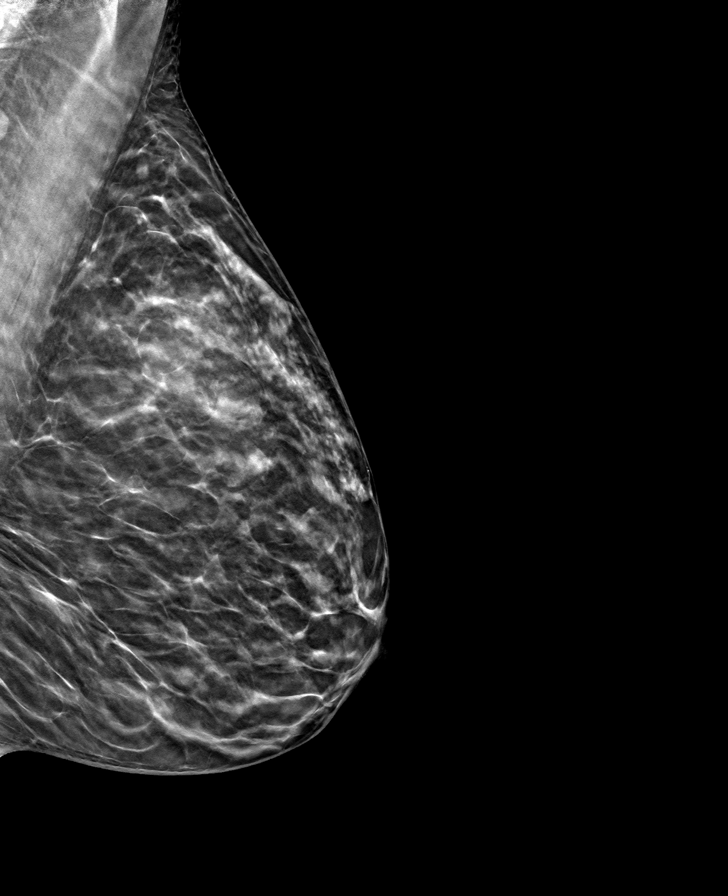

[8 of 40 positions shown; findings below may reference images not displayed]

ACR Breast Density Category c: The breast tissue is heterogeneously
dense, which may obscure small masses.
FINDINGS: There are no dominant masses, suspicious calcifications or secondary
signs of malignancy identified within either breast. Specifically,
there is no mammographic abnormality within the upper-outer quadrant
of the RIGHT breast corresponding to the palpable area of concern,
with overlying skin marker in place.

Targeted ultrasound is performed, evaluating the upper-outer
quadrant of the RIGHT breast as directed by the patient, showing
only normal fibroglandular tissues and fat lobules throughout. No
solid or cystic mass. There is a ridge of normal dense
fibroglandular tissue at the 11 o'clock axis, corresponding to the
palpable area of concern.
IMPRESSION: No evidence of malignancy within either breast.

RECOMMENDATION:
1. Screening mammogram at age 40 unless there are persistent or
intervening clinical concerns. (Code:[id number])
2. The patient was instructed to return sooner if the area that she
feels becomes larger and/or firmer to palpation, or if a new
palpable abnormality is identified in either breast.

I have discussed the findings and recommendations with the patient.
If applicable, a reminder letter will be sent to the patient
regarding the next appointment.

BI-RADS CATEGORY  1: Negative.

## 2022-05-07 ENCOUNTER — Encounter (HOSPITAL_BASED_OUTPATIENT_CLINIC_OR_DEPARTMENT_OTHER): Payer: Self-pay | Admitting: Emergency Medicine

## 2022-05-07 ENCOUNTER — Emergency Department (HOSPITAL_BASED_OUTPATIENT_CLINIC_OR_DEPARTMENT_OTHER)
Admission: EM | Admit: 2022-05-07 | Discharge: 2022-05-07 | Disposition: A | Discharge status: Home / Self Care | Payer: No Typology Code available for payment source | Attending: Emergency Medicine | Admitting: Emergency Medicine

## 2022-05-07 ENCOUNTER — Other Ambulatory Visit: Payer: Self-pay

## 2022-05-07 DIAGNOSIS — N644 Mastodynia: Secondary | ICD-10-CM | POA: Insufficient documentation

## 2022-05-07 DIAGNOSIS — N63 Unspecified lump in unspecified breast: Secondary | ICD-10-CM

## 2022-05-07 NOTE — ED Triage Notes (Signed)
Right breast pain and swelling x 1 month. States she had a similar episode in same breast earlier this year and had a neg mammogram. Pain and swelling has since returned. Denies nipple drainage.

## 2022-05-07 NOTE — Discharge Instructions (Signed)
You were seen in the emergency department for tenderness and possible mass in your right breast.  This area is going to need further evaluation.  Please contact the breast center on Monday.  You may need to go through gynecology and we are giving you the number for the [facility].  Return to the emergency department if any worsening or concerning symptoms.

## 2022-05-07 NOTE — ED Provider Notes (Signed)
[facility] EMERGENCY DEPARTMENT Provider Note   CSN: [id number] Arrival date & time: 05/07/22  1908     History {Add pertinent medical, surgical, social history, OB history to HPI:1} Chief Complaint  Patient presents with   Breast Pain    R    Brittany Pierce is a 31 y.o. female.  She has no significant past medical history.  She is complaining of some pain and swelling of her right breast has been going on about a month.  She had a similar problem about a year ago and had an mammogram.  Has had no discharge from the breast no fevers no trauma.  She is not breast-feeding and is not on any hormone treatments.  She unfortunately does not have a primary care doctor.  No other lymphadenopathy fevers or weight loss.  The history is provided by the patient.       Home Medications Prior to Admission medications   Medication Sig Start Date End Date Taking? Authorizing Provider  acetaminophen (TYLENOL) 500 MG tablet Take 500 mg by mouth every 6 (six) hours as needed. Patient not taking: No sig reported    [provider]  benzonatate (TESSALON) 100 MG capsule Take 1 capsule (100 mg total) by mouth 3 (three) times daily as needed for cough. Patient not taking: Reported on 05/19/2021 05/11/21   Quintella Reichert, MD  doxycycline (VIBRA-TABS) 100 MG tablet Take 100 mg by mouth 2 (two) times daily. Patient not taking: No sig reported    [provider]  ibuprofen (ADVIL,MOTRIN) 800 MG tablet Take 1 tablet (800 mg total) by mouth every 8 (eight) hours as needed. Patient not taking: No sig reported 12/13/14   Beers, Pierce Crane, PA-C  metoCLOPramide (REGLAN) 10 MG tablet Take 1 tablet (10 mg total) by mouth every 6 (six) hours. Patient not taking: No sig reported 07/13/18   Jorje Guild, NP  norethindrone (MICRONOR) 0.35 MG tablet Take 1 tablet (0.35 mg total) by mouth daily. 05/19/21   Patriciaann Clan, DO  ondansetron (ZOFRAN ODT) 4 MG disintegrating tablet Take 1  tablet (4 mg total) by mouth every 8 (eight) hours as needed for nausea or vomiting. Patient not taking: No sig reported 07/13/18   Jorje Guild, NP  Prenatal Vit-Fe Fumarate-FA (PRENATAL MULTIVITAMIN) TABS tablet Take 1 tablet by mouth daily at 12 noon. Patient not taking: No sig reported    [provider]  promethazine (PHENERGAN) 25 MG tablet Take 1 tablet (25 mg total) by mouth every 6 (six) hours as needed for nausea or vomiting. Patient not taking: Reported on 05/19/2021 05/11/21   Quintella Reichert, MD  sucralfate (CARAFATE) 1 g tablet Take 1 tablet (1 g total) by mouth 4 (four) times daily. Patient not taking: No sig reported 11/08/15   Lacretia Leigh, MD      Allergies    Doxycycline    Review of Systems   Review of Systems  Constitutional:  Negative for fever.  HENT:  Negative for sore throat.   Respiratory:  Negative for shortness of breath.   Cardiovascular:  Negative for chest pain.  Gastrointestinal:  Negative for abdominal pain.  Genitourinary:  Negative for dysuria.  Skin:  Negative for rash.    Physical Exam Updated Vital Signs BP (!) 121/56   Pulse (!) 106   Temp 98.4 F (36.9 C) (Oral)   Resp 17   Ht 5\' 4"  (1.626 m)   Wt 59 kg   LMP 04/12/2022 (Approximate)   SpO2 100%  BMI 22.31 kg/m  Physical Exam Vitals and nursing note reviewed. Exam conducted with a chaperone present (nurse Gia B).  Constitutional:      General: She is not in acute distress.    Appearance: Normal appearance. She is well-developed.  HENT:     Head: Normocephalic and atraumatic.  Eyes:     Conjunctiva/sclera: Conjunctivae normal.  Cardiovascular:     Rate and Rhythm: Normal rate and regular rhythm.     Heart sounds: No murmur heard. Pulmonary:     Effort: Pulmonary effort is normal. No respiratory distress.     Breath sounds: Normal breath sounds. No stridor. No wheezing.  Chest:  Breasts:    Right: Mass and tenderness present. No bleeding, nipple discharge or skin  change.    Abdominal:     Palpations: Abdomen is soft.     Tenderness: There is no abdominal tenderness. There is no guarding or rebound.  Musculoskeletal:        General: No tenderness or deformity. Normal range of motion.     Cervical back: Neck supple.  Lymphadenopathy:     Upper Body:     Right upper body: No supraclavicular, axillary or pectoral adenopathy.  Skin:    General: Skin is warm and dry.  Neurological:     General: No focal deficit present.     Mental Status: She is alert.     GCS: GCS eye subscore is 4. GCS verbal subscore is 5. GCS motor subscore is 6.     ED Results / Procedures / Treatments   Labs (all labs ordered are listed, but only abnormal results are displayed) Labs Reviewed - No data to display  EKG None  Radiology No results found.  Procedures Procedures  {Document cardiac monitor, telemetry assessment procedure when appropriate:1}  Medications Ordered in ED Medications - No data to display  ED Course/ Medical Decision Making/ A&P                           Medical Decision Making  ***  {Document critical care time when appropriate:1} {Document review of labs and clinical decision tools ie heart score, Chads2Vasc2 etc:1}  {Document your independent review of radiology images, and any outside records:1} {Document your discussion with family members, caretakers, and with consultants:1} {Document social determinants of health affecting pt's care:1} {Document your decision making why or why not admission, treatments were needed:1} Final Clinical Impression(s) / ED Diagnoses Final diagnoses:  None    Rx / DC Orders ED Discharge Orders     None

## 2022-12-17 ENCOUNTER — Encounter (HOSPITAL_BASED_OUTPATIENT_CLINIC_OR_DEPARTMENT_OTHER): Payer: Self-pay

## 2022-12-17 ENCOUNTER — Emergency Department (HOSPITAL_BASED_OUTPATIENT_CLINIC_OR_DEPARTMENT_OTHER)
Admission: EM | Admit: 2022-12-17 | Discharge: 2022-12-17 | Disposition: A | Discharge status: Home / Self Care | Payer: PRIVATE HEALTH INSURANCE | Attending: Emergency Medicine | Admitting: Emergency Medicine

## 2022-12-17 ENCOUNTER — Other Ambulatory Visit: Payer: Self-pay

## 2022-12-17 DIAGNOSIS — X58XXXA Exposure to other specified factors, initial encounter: Secondary | ICD-10-CM | POA: Diagnosis not present

## 2022-12-17 DIAGNOSIS — S161XXA Strain of muscle, fascia and tendon at neck level, initial encounter: Secondary | ICD-10-CM | POA: Insufficient documentation

## 2022-12-17 DIAGNOSIS — S199XXA Unspecified injury of neck, initial encounter: Secondary | ICD-10-CM | POA: Diagnosis present

## 2022-12-17 MED ORDER — METHOCARBAMOL 500 MG PO TABS: 500.0000 mg | ORAL_TABLET | Freq: Two times a day (BID) | ORAL | 0 refills | Status: AC

## 2022-12-17 MED ORDER — KETOROLAC TROMETHAMINE 15 MG/ML IJ SOLN
15.0000 mg | Freq: Once | INTRAMUSCULAR | Status: AC
  Administered 2022-12-17: 15 mg via INTRAMUSCULAR
  Filled 2022-12-17: qty 1

## 2022-12-17 NOTE — Discharge Instructions (Signed)

## 2022-12-17 NOTE — ED Triage Notes (Addendum)
Patient has been having neck pain for 6 months. Stated it has been worse over the last week. She was seen at [facility] yesterday and they gave her baclofen. She stated that is causing as rash on her face.

## 2022-12-17 NOTE — ED Provider Notes (Signed)
[place] [facility] Provider Note   CSN: [id number] Arrival date & time: 12/17/22  7829     History  Chief Complaint  Patient presents with   Neck Pain    Brittany Pierce is a 32 y.o. female with overall noncontributory past medical history who presents with concern for right sided neck pain for 6 months, worse over the last week. Patient endorses she went to chiropractor and it has been somewhat worse since then. Denies any numbness, tingling, fever, chills, vision changes. Patient denies any history of IVDU, chronic corticosteroid use, history of cancer.  Patient reports that she went to urgent care yesterday and has tried baclofen x 2, has not tried any ibuprofen, Tylenol or other medication at this time.  Has not tried any rehab exercises.   Neck Pain      Home Medications Prior to Admission medications   Medication Sig Start Date End Date Taking? Authorizing Provider  methocarbamol (ROBAXIN) 500 MG tablet Take 1 tablet (500 mg total) by mouth 2 (two) times daily. 12/17/22  Yes Cedra Villalon H, PA-C  acetaminophen (TYLENOL) 500 MG tablet Take 500 mg by mouth every 6 (six) hours as needed. Patient not taking: No sig reported    [provider]  benzonatate (TESSALON) 100 MG capsule Take 1 capsule (100 mg total) by mouth 3 (three) times daily as needed for cough. Patient not taking: Reported on 05/19/2021 05/11/21   Tilden Fossa, MD  doxycycline (VIBRA-TABS) 100 MG tablet Take 100 mg by mouth 2 (two) times daily. Patient not taking: No sig reported    [provider]  ibuprofen (ADVIL,MOTRIN) 800 MG tablet Take 1 tablet (800 mg total) by mouth every 8 (eight) hours as needed. Patient not taking: No sig reported 12/13/14   Beers, Charmayne Sheer, PA-C  metoCLOPramide (REGLAN) 10 MG tablet Take 1 tablet (10 mg total) by mouth every 6 (six) hours. Patient not taking: No sig reported 07/13/18   Judeth Horn, NP  norethindrone  (MICRONOR) 0.35 MG tablet Take 1 tablet (0.35 mg total) by mouth daily. 05/19/21   Allayne Stack, DO  ondansetron (ZOFRAN ODT) 4 MG disintegrating tablet Take 1 tablet (4 mg total) by mouth every 8 (eight) hours as needed for nausea or vomiting. Patient not taking: No sig reported 07/13/18   Judeth Horn, NP  Prenatal Vit-Fe Fumarate-FA (PRENATAL MULTIVITAMIN) TABS tablet Take 1 tablet by mouth daily at 12 noon. Patient not taking: No sig reported    [provider]  promethazine (PHENERGAN) 25 MG tablet Take 1 tablet (25 mg total) by mouth every 6 (six) hours as needed for nausea or vomiting. Patient not taking: Reported on 05/19/2021 05/11/21   Tilden Fossa, MD  sucralfate (CARAFATE) 1 g tablet Take 1 tablet (1 g total) by mouth 4 (four) times daily. Patient not taking: No sig reported 11/08/15   Lorre Nick, MD      Allergies    Doxycycline and Baclofen    Review of Systems   Review of Systems  Musculoskeletal:  Positive for neck pain.  All other systems reviewed and are negative.   Physical Exam Updated Vital Signs BP 107/64   Pulse 79   Temp 98.1 F (36.7 C) (Oral)   Resp 16   Ht 5\' 4"  (1.626 m)   Wt 57.2 kg   LMP 11/23/2022 (Approximate)   SpO2 100%   BMI 21.63 kg/m  Physical Exam Vitals and nursing note reviewed.  Constitutional:  General: She is not in acute distress.    Appearance: Normal appearance.  HENT:     Head: Normocephalic and atraumatic.  Eyes:     General:        Right eye: No discharge.        Left eye: No discharge.  Cardiovascular:     Rate and Rhythm: Normal rate and regular rhythm.     Heart sounds: No murmur heard.    No friction rub. No gallop.  Pulmonary:     Effort: Pulmonary effort is normal.     Breath sounds: Normal breath sounds.     Comments: Normal breath sounds bilaterally, no wheezing, rhonchi, crackles, rales Abdominal:     General: Bowel sounds are normal.     Palpations: Abdomen is soft.   Musculoskeletal:     Comments: Patient with some focal tenderness palpation in the right trapezius muscles.  Tight muscle knot palpated.  No significant tenderness palpation midline cervical spine.  Intact strength 5/5 bilateral upper extremities.  Normal coordination of upper extremities.  No tenderness over the right humeral head, right clavicle.  Skin:    General: Skin is warm and dry.     Capillary Refill: Capillary refill takes less than 2 seconds.  Neurological:     Mental Status: She is alert and oriented to person, place, and time.  Psychiatric:        Mood and Affect: Mood normal.        Behavior: Behavior normal.     ED Results / Procedures / Treatments   Labs (all labs ordered are listed, but only abnormal results are displayed) Labs Reviewed - No data to display  EKG None  Radiology No results found.  Procedures Procedures    Medications Ordered in ED Medications  ketorolac (TORADOL) 15 MG/ML injection 15 mg (15 mg Intramuscular Given 12/17/22 0981)    ED Course/ Medical Decision Making/ A&P                             Medical Decision Making  This patient is a 32 y.o. female who presents to the ED for concern of neck pain without trauma.   Differential diagnoses prior to evaluation: Cervical sprain, strain, cervical spondylosis, radiculopathy, unstable cervical spine fracture, epidural abscess, osteomyelitis thought to be less likely based on patient's risk factors, and lack of neurodeficit  Past Medical History / Social History / Additional history: Chart reviewed. Pertinent results include: Overall noncontributory  Physical Exam: Physical exam performed. The pertinent findings include: : Patient with some focal tenderness palpation in the right trapezius muscles.  Tight muscle knot palpated.  No significant tenderness palpation midline cervical spine.  Intact strength 5/5 bilateral upper extremities.  Normal coordination of upper extremities.  No  tenderness over the right humeral head, right clavicle.  Medications / Treatment: Administered IM Toradol, encouraged ibuprofen, Tylenol, rest, rehab exercises, changed her prescription to Robaxin to use as needed, and encouraged close orthopedic follow-up.   Disposition: After consideration of the diagnostic results and the patients response to treatment, I feel that patient is stable for discharge with findings consistent with acute cervical strain .   emergency department workup does not suggest an emergent condition requiring admission or immediate intervention beyond what has been performed at this time. The plan is: as above. The patient is safe for discharge and has been instructed to return immediately for worsening symptoms, change in symptoms or any other concerns.  Final Clinical Impression(s) / ED Diagnoses Final diagnoses:  Acute strain of neck muscle, initial encounter    Rx / DC Orders ED Discharge Orders          Ordered    methocarbamol (ROBAXIN) 500 MG tablet  2 times daily        12/17/22 0917              Shakeera Rightmyer, Harrel Carina, PA-C 12/17/22 0925    Loetta Rough, MD 12/17/22 1039

## 2022-12-21 ENCOUNTER — Ambulatory Visit (INDEPENDENT_AMBULATORY_CARE_PROVIDER_SITE_OTHER): Payer: PRIVATE HEALTH INSURANCE | Admitting: Family Medicine

## 2022-12-21 ENCOUNTER — Encounter: Payer: Self-pay | Admitting: Family Medicine

## 2022-12-21 VITALS — BP 104/70 | Ht 64.0 in | Wt 126.0 lb

## 2022-12-21 DIAGNOSIS — S46811A Strain of other muscles, fascia and tendons at shoulder and upper arm level, right arm, initial encounter: Secondary | ICD-10-CM

## 2022-12-21 DIAGNOSIS — M542 Cervicalgia: Secondary | ICD-10-CM | POA: Diagnosis not present

## 2022-12-21 DIAGNOSIS — G8929 Other chronic pain: Secondary | ICD-10-CM | POA: Diagnosis not present

## 2022-12-21 MED ORDER — PREDNISONE 10 MG PO TABS: ORAL_TABLET | ORAL | 0 refills | Status: AC

## 2022-12-21 NOTE — Progress Notes (Signed)
PCP: Patient, No Pcp Per  Subjective:   HPI: Patient is a 32 y.o. female here for neck pain.  Patient reports about 6 months of right sided neck pain. No acute injury or trauma. Massage initially seemed to help but pain then was between shoulder blades - seems to have moved to right side of neck now. No radiation of pain though does get tingling locally and into fingertips of both arms. Had toradol at urgent care with temporary relief. Baclofen, robaxin, tylenol have not helped. Has not done physical therapy or home exercises.  Past Medical History:  Diagnosis Date   Anemia    Depression    per prenatal   No pertinent past medical history     Current Outpatient Medications on File Prior to Visit  Medication Sig Dispense Refill   acetaminophen (TYLENOL) 500 MG tablet Take 500 mg by mouth every 6 (six) hours as needed. (Patient not taking: No sig reported)     benzonatate (TESSALON) 100 MG capsule Take 1 capsule (100 mg total) by mouth 3 (three) times daily as needed for cough. (Patient not taking: Reported on 05/19/2021) 15 capsule 0   ibuprofen (ADVIL,MOTRIN) 800 MG tablet Take 1 tablet (800 mg total) by mouth every 8 (eight) hours as needed. (Patient not taking: No sig reported) 30 tablet 0   methocarbamol (ROBAXIN) 500 MG tablet Take 1 tablet (500 mg total) by mouth 2 (two) times daily. 20 tablet 0   metoCLOPramide (REGLAN) 10 MG tablet Take 1 tablet (10 mg total) by mouth every 6 (six) hours. (Patient not taking: No sig reported) 30 tablet 0   norethindrone (MICRONOR) 0.35 MG tablet Take 1 tablet (0.35 mg total) by mouth daily. 28 tablet 3   ondansetron (ZOFRAN ODT) 4 MG disintegrating tablet Take 1 tablet (4 mg total) by mouth every 8 (eight) hours as needed for nausea or vomiting. (Patient not taking: No sig reported) 15 tablet 0   Prenatal Vit-Fe Fumarate-FA (PRENATAL MULTIVITAMIN) TABS tablet Take 1 tablet by mouth daily at 12 noon. (Patient not taking: No sig reported)      promethazine (PHENERGAN) 25 MG tablet Take 1 tablet (25 mg total) by mouth every 6 (six) hours as needed for nausea or vomiting. (Patient not taking: Reported on 05/19/2021) 12 tablet 0   sucralfate (CARAFATE) 1 g tablet Take 1 tablet (1 g total) by mouth 4 (four) times daily. (Patient not taking: No sig reported) 30 tablet 0   No current facility-administered medications on file prior to visit.    Past Surgical History:  Procedure Laterality Date   CESAREAN SECTION     HERNIA REPAIR      Allergies  Allergen Reactions   Doxycycline Nausea And Vomiting   Baclofen Hives    BP 104/70 (BP Location: Left Arm, Patient Position: Sitting)   Ht 5\' 4"  (1.626 m)   Wt 126 lb (57.2 kg)   LMP 11/23/2022 (Approximate)   BMI 21.63 kg/m       No data to display              No data to display              Objective:  Physical Exam:  Gen: NAD, comfortable in exam room  Neck: No gross deformity, swelling, bruising.  Spasm right trapezius TTP right trapezius.  No midline/bony TTP. Very limited ROM - flexion 15 degrees, extension 5 degrees, bilateral lateral rotations 20 degrees. BUE strength 5/5.   Sensation intact to light  touch.   2+ equal reflexes in triceps, biceps, brachioradialis tendons. Negative spurlings. NV intact distal BUEs.   Assessment & Plan:  1. Right trapezius strain - start physical therapy and home exercises.  Prednisone dose pack.  Tylenol, robaxin if needed though these haven't helped to this point.  Consider x-rays, MRI if not improving.

## 2022-12-21 NOTE — Patient Instructions (Signed)
You have a trapezius strain. Take prednisone dose pack as directed x 6 days. Day AFTER finishing this you could take aleve 2 tabs twice a day with food. Ok to take tylenol, the robaxin with this as needed. Start physical therapy and do home exercises on days you don't go to therapy. Heat 15 minutes at a time 3-4 times a day as needed. Follow up with me in 6 weeks. If not improving would consider x-rays, MRI of your cervical spine.

## 2022-12-27 ENCOUNTER — Ambulatory Visit (HOSPITAL_COMMUNITY)
Admission: EM | Admit: 2022-12-27 | Discharge: 2022-12-27 | Disposition: A | Discharge status: Home / Self Care | Payer: Medicaid Other | Attending: Behavioral Health | Admitting: Behavioral Health

## 2022-12-27 ENCOUNTER — Encounter (HOSPITAL_COMMUNITY): Payer: Self-pay | Admitting: Behavioral Health

## 2022-12-27 DIAGNOSIS — F1910 Other psychoactive substance abuse, uncomplicated: Secondary | ICD-10-CM | POA: Insufficient documentation

## 2022-12-27 DIAGNOSIS — F331 Major depressive disorder, recurrent, moderate: Secondary | ICD-10-CM | POA: Insufficient documentation

## 2022-12-27 NOTE — Progress Notes (Signed)
   12/27/22 1309  [facility] Triage Screening (Walk-ins at [facility] only)  How Did You Hear About [place]? Self  What Is the Reason for Your Visit/Call Today? Pt reports she "needs to talk with someone now" to find out why she keep making the mistake of allowing other to break her celibacy. Pt denies SI, HI, AVH. Reports hx of seeing shadow people". Pt reports daily THC use, last used before coming in today. Pt reports for the past few nights she has been drking one mixed drink at night. Pt is tearful and looking for outpatient services.  How Long Has This Been Causing You Problems? > than 6 months  Have You Recently Had Any Thoughts About Hurting Yourself? No (reports passive SI)  Are You Planning to Commit Suicide/Harm Yourself At This time? No  Have you Recently Had Thoughts About Hurting Someone [name]? Yes  How long ago did you have thoughts of harming others? couple days ago thought about running her car into the gas station  Are You Planning To Harm Someone At This Time? No  Are you currently experiencing any auditory, visual or other hallucinations? No (hx of seeing shadow people)  Have You Used Any Alcohol or Drugs in the Past 24 Hours? Yes  How long ago did you use Drugs or Alcohol? today used thc  What Did You Use and How Much? unknown  Do you have any current medical co-morbidities that require immediate attention? No  Clinician description of patient physical appearance/behavior: tearful  What Do You Feel Would Help You the Most Today? Treatment for Depression or other mood problem  If access to [facility] was not available, would you have sought care in the Emergency Department? No  Determination of Need Routine (7 days)  Options For Referral Medication Management;Outpatient Therapy

## 2022-12-27 NOTE — Discharge Instructions (Addendum)
Safety Plan Brittany Pierce will reach out to her Brittany, call 911 or call mobile crisis, or go to nearest emergency room if condition worsens or if suicidal thoughts become active Patient will follow up with outpatient psychiatric services (therapy/medication management).  The suicide prevention education provided includes the following: Suicide risk factors Suicide prevention and interventions National Suicide Hotline telephone number [facility] assessment telephone number [facility] Emergency Assistance 911 Brittany Pierce and/or Residential Mobile Crisis Unit telephone number Request made of family/significant other to:  Brittany Pierce [phone] Brittany Pierce weapons (e.g., guns, rifles, knives), all items previously/currently identified as safety concern.   Remove drugs/medications (over the counter, prescriptions, illicit drugs), all items previously/currently identified as a safety concern. Discussed methods to reduce the risk of self-injury or suicide attempts: Frequent conversations regarding unsafe thoughts. Remove all significant sharps. Remove all firearms. Remove all medications, including over-the-counter medications. Consider lockbox for medications and having a responsible person dispense medications until patient has strengthened coping skills. Room checks for sharps or other harmful objects. Secure all chemical substances that can be ingested or inhaled.      Brittany Pierce: Outpatient psychiatric Services:   Please see the walk in hours listed below.  Medication Management New Patient needing Medication Management Walk-in, and Existing Patients needing to see a provider for management coming as a walk in   Monday thru Friday 8:00 AM first come first serve until slots are full.  Recommend being there by 7:15 AM to ensure a slot is open.  Therapy New Patient Therapy Intake and Existing Patients needing to see therapist coming in  as a walk in.   Monday, Wednesday, and Thursday morning at 8:00 am first come first serve.  Recommend being there by 7:15 AM to ensure a slot is open.    Every 1st, 2nd, and 3rd Friday at 1:00 PM first come first serve until slots are full.  Will still need to come in that morning at 7:15 AM to get registered for an afternoon slot.  For all walk-ins we ask that you arrive by 7:15 am because patients will be seen in there order of arrival (FIRST COME FIRST SERVE) Availability is limited, therefore you may not be seen on the same day that you walk in if all slots are full.    Our goal is to serve and meet the needs of our community to the best of our ability.    Based on what you have shared, a list of resources for outpatient therapy and psychiatry is provided below to get you started back on treatment.  It is imperative that you follow through with treatment within 5-7 days from the day of discharge to prevent any further risk to your safety or mental well-being.  You are not limited to the list provided.  In case of an urgent crisis, you may contact the Mobile Crisis Unit with Brittany Pierce at [phone].        Outpatient Services for Therapy and Medication Management for [facility] [address], [place], [address] [phone] phone  New Patient Assessment/Therapy Walk-ins Monday and Wednesday: 8am until slots are full. Every 1st and 2nd Friday: 1pm - 5pm  NO ASSESSMENT/THERAPY WALK-INS ON TUESDAYS OR THURSDAYS  New Patient Psychiatry/Medication Management Walk-ins Monday-Friday: 8am-11am  For all walk-ins, we ask that you arrive by 7:30am because patient will be seen in the order of arrival.  Availability is limited; therefore, you may not be  seen on the same day that you walk-in.  Our goal is to serve and meet the needs of our community to the best of our ability.   [facility] [address] [place], [place], [address] [phone] phone  [facility] [address] [place], [place], [address] [phone] phone [phone] phone ([address], [address], Anthem/Elevance, [address], [address], [address], [address], Healthy [place], [place], [place], [address], [employer], [place], UHC, [employer] Financial, [place], Out of Network)  [employer], [place] [address] [place], [place], [address] [phone] phone ([place], Anthem/Elevance, [employer] Options/Carelon, BCBS, [address], [place], [place], [place], [place], [employer], [place], [place], [place], [facility])  [employer] [address] [place], [place], [address] [phone] phone (Medicaid, ask about other insurance)  [facility] [address], [address] [place], [place], [address] [phone] phone [phone] fax ([address] [place] , [place], [place], [place] Health Choice, UHC, [employer] Electric, Self-Pay)  [facility] [address] [facility] [address] [place], [place], [address] [phone] phone ([address], Anthem/Elevance, [address], [address], [place], [employer], [place], [place], [place], [employer], [place], [place], [place], [facility])  [facility] - 6-8 MONTH WAIT FOR THERAPY; SOONER FOR MEDICATION MANAGEMENT [address], [address] [place], [place], [address] [phone] phone ([address], AmeriHealth [address] - [place], [address], [place], [place], Friday Health Plans, [address], BCBS Healthy [place], [place], [address], [place], [place], [place], [place], Tricare, UHC, [employer], [place])  [facility] [address] [place], [place], [address] [phone] phone  [facility] [address], [address] [place], [place], [address] [phone] phone  [facility] [address] [place], [place], [address] [phone] phone  [facility] [place] - THERAPY ONLY [address] [place], [address] [phone] phone  [facility] [place], [place] [address], [place], [address] [phone] phone  [facility] [address] [place], [place], [address] [phone] phone [phone] fax  [facility] [address] [place], [place], [address] [phone] phone [phone] fax  [facility] [id number] [place], [place], [address] [phone] phone  [facility] Kussmaul [address] [place], [place], [address]  [phone] phone  [facility]  (Adults Only) [address] [place], [place], [address]  [phone] phone [phone] fax

## 2022-12-27 NOTE — ED Provider Notes (Cosign Needed Addendum)
[facility] Medical Screening Exam  Patient Name: Brittany Pierce MRN: [id number] Date of Evaluation: 12/27/22 Chief Complaint:  "I'm just tired of making stupid life decisions when I know better" Diagnosis:  Final diagnoses:  Moderate episode of recurrent major depressive disorder (HCC)    History of Present Illness: Brittany Pierce is a 32 y.o. female patient with a past psychiatric history of MDD, anxiety, and avoidant-restrictive food intake disorder who presented to [facility] voluntarily and unaccompanied requesting to speak with a therapist because "I'm just tired of making stupid life decisions when I know better."   Patient assessed face-to-face by this provider and chart reviewed on 12/27/22. On evaluation, Brittany Pierce is seated in assessment area in no acute distress. Patient is alert and oriented x4, calm, cooperative, and pleasant. Speech is clear and coherent, normal rate and volume. Eye contact is good. Mood is depressed with tearful and congruent affect. Thought process is coherent with logical thought content. Patient denies current suicidal and homicidal ideations and easily contracts verbally for safety with this Clinical research associate. Patient reports recently experiencing passive suicidal ideations with no plan or intent but states "I made a promise to myself and my son 12 years ago not to do that." Patient reports having an "intrusive thought" several days ago about running her car into the gas station. Patient states "as soon as I had the thought I stopped myself and laughed because I knew I wasn't going to do it." Patient denies a history of suicide attempts. Patient reports a history of self-harm by cutting more than 12 years ago. Patient reports one past psychiatric hospitalization when she was 32 y/o "because my mom found out I was cutting myself." Patient denies current auditory and visual hallucinations but reports she has "been seeing shadow people my whole life." Patient  states this occurs once a year, typically between April-May, but hasn't occurred this year. Patient denies symptoms of paranoia. Patient is able to converse coherently with goal-directed thoughts and no distractibility or preoccupation. Objectively, there is no evidence of psychosis/mania, delusional thinking, or indication that patient is responding top internal or external stimuli.  Patient reports fair sleep (5-7 hours/night) and decreased appetite. Patient states she lives with her 81 y/o son and denies access to weapons/firearms. Patient identifies her sister as her support system. 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 Patient reports marijuana use of 5-6 blunts per day, last use today. Patient reports recent alcohol use stating "I'm not a drinker but for the past few nights I've had 1 mixed drink but I don't even finish it." Patient denies use of other illicit substances. Patient denies having current outpatient psychiatric services in place for therapy or medication management. Patient states she did receive outpatient services at [facility] approximately 2 years ago via Zoom but "it wasn't enough so I stopped." Patient states she is looking for outpatient services, is interested in starting therapy again, and prefers to go in-person. Patient states "I'm just tired of making stupid life decisions when I know better. I keep doing stupid shit every year and I don't learn my lesson." When asked to elaborate, patient states "the people I allow into my space and life, I do it because I'm bored and have been couped up in the house for months." Per triage screening 12/27/22 1309: "Pt reports she "needs to talk with someone now" to find out why she keep making the mistake of allowing other to break her celibacy."   Patient gave verbal consent  for provider to contact her sister for collateral information and safety planning. Call made to patient's sister [name] [phone]) who denies any  safety concerns for patient. Patient offered support and encouragement. Discussed the following safety plan with patient and her sister:  Safety Plan [name] will reach out to her sister, call 911 or call mobile crisis, or go to nearest emergency room if condition worsens or if suicidal thoughts become active Patient will follow up with outpatient psychiatric services (therapy/medication management).  The suicide prevention education provided includes the following: Suicide risk factors Suicide prevention and interventions National Suicide Hotline telephone number [facility] assessment telephone number [facility] Emergency Assistance 911 [facility] and/or Residential Mobile Crisis Unit telephone number Request made of family/significant other to:  Sister [name] [phone]) [name] weapons (e.g., guns, rifles, knives), all items previously/currently identified as safety concern.   Remove drugs/medications (over the counter, prescriptions, illicit drugs), all items previously/currently identified as a safety concern. Discussed methods to reduce the risk of self-injury or suicide attempts: Frequent conversations regarding unsafe thoughts. Remove all significant sharps. Remove all firearms. Remove all medications, including over-the-counter medications. Consider lockbox for medications and having a responsible person dispense medications until patient has strengthened coping skills. Room checks for sharps or other harmful objects. Secure all chemical substances that can be ingested or inhaled.   Discussed with patient following up with outpatient psychiatric resources provided in AVS for therapy and medication management. Patient and her sister are in agreement with plan of care.  At this time, [name] is educated and verbalizes understanding of mental health resources and other crisis services in the community. She is instructed to call 911 and present to  the nearest emergency room should she experience any suicidal/homicidal ideation, auditory/visual/hallucinations, or detrimental worsening of her mental health condition.    Flowsheet Row ED from 12/27/2022 in [facility] ED from 12/17/2022 in [facility] Emergency Department at [facility] ED from 05/07/2022 in [facility] Emergency Department at [facility]  C-SSRS RISK CATEGORY Low Risk No Risk No Risk       Psychiatric Specialty Exam  Presentation  General Appearance:Appropriate for Environment  Eye Contact:Good  Speech:Clear and Coherent; Normal Rate  Speech Volume:Normal  Handedness:Right   Mood and Affect  Mood: Depressed  Affect: Congruent; Tearful   Thought Process  Thought Processes: Coherent; Goal Directed  Descriptions of Associations:Intact  Orientation:Full (Time, Place and Person)  Thought Content:Logical  Diagnosis of Schizophrenia or Schizoaffective disorder in past: No Hallucinations:None (Denies current AVH, reports a history of "seeing shadow people my whole life")  Ideas of Reference:None  Suicidal Thoughts:No (Denies current SI, reports having recent passive SI with no plan/intent)  Homicidal Thoughts:No (Denies current HI, reports having an "intrusive thought" recently about running her car into a gas station)   Sensorium  Memory: Immediate Good; Recent Good; Remote Good  Judgment: Fair  Insight: Fair   Art therapist  Concentration: Good  Attention Span: Good  Recall: Good  Fund of Knowledge: Good  Language: Good   Psychomotor Activity  Psychomotor Activity: Normal   Assets  Assets: Communication Skills; Desire for Improvement; Financial Resources/Insurance; Housing; Leisure Time; Physical Health; Resilience; Social Support; Transportation   Sleep  Sleep: Fair  Number of hours:  5-7   Physical Exam: Physical Exam Vitals and nursing note reviewed.   Constitutional:      General: She is not in acute distress.    Appearance: Normal appearance.  She is not ill-appearing.  HENT:     Head: Normocephalic and atraumatic.     Nose: Nose normal.  Eyes:     General:        Right eye: No discharge.        Left eye: No discharge.     Conjunctiva/sclera: Conjunctivae normal.  Cardiovascular:     Rate and Rhythm: Normal rate.  Pulmonary:     Effort: Pulmonary effort is normal. No respiratory distress.  Musculoskeletal:        General: Normal range of motion.     Cervical back: Normal range of motion.  Skin:    General: Skin is warm and dry.  Neurological:     General: No focal deficit present.     Mental Status: She is alert and oriented to person, place, and time. Mental status is at baseline.  Psychiatric:        Attention and Perception: Attention and perception normal. Visual hallucinations: Denies current AVH, reports a history of "seeing shadow people my whole life".        Mood and Affect: Mood is depressed. Affect is tearful.        Speech: Speech normal.        Behavior: Behavior normal. Behavior is cooperative.        Thought Content: Thought content normal. Thought content is not paranoid or delusional. Thought content does not include homicidal (Denies current HI, reports having an "intrusive thought" recently about running her car into a gas station) or suicidal (Denies current SI, reports having recent passive SI with no plan/intent) ideation. Thought content does not include homicidal or suicidal plan.        Cognition and Memory: Cognition and memory normal.        Judgment: Judgment normal.    Review of Systems  Constitutional: Negative.   HENT: Negative.    Eyes: Negative.   Respiratory: Negative.    Cardiovascular: Negative.   Gastrointestinal: Negative.   Genitourinary: Negative.   Musculoskeletal: Negative.   Skin: Negative.   Neurological: Negative.   Endo/Heme/Allergies: Negative.   Psychiatric/Behavioral:   Positive for depression and substance abuse. Negative for hallucinations (Denies current AVH, reports a history of "seeing shadow people my whole life"), memory loss and suicidal ideas (Denies current SI, reports having recent passive SI with no plan/intent). The patient is not nervous/anxious and does not have insomnia.    Blood pressure 112/65, pulse 96, temperature 98.6 F (37 C), temperature source Oral, resp. rate 20, last menstrual period 11/23/2022, SpO2 100 %. There is no height or weight on file to calculate BMI.  Musculoskeletal: Strength & Muscle Tone: within normal limits Gait & Station: normal Patient leans: N/A   [facility] MSE Discharge Disposition for Follow up and Recommendations: Based on my evaluation the patient does not appear to have an emergency medical condition and can be discharged with resources and follow up care in outpatient services for Medication Management and Individual Therapy   Sunday Corn, NP 12/27/2022, 3:16 PM

## 2022-12-28 ENCOUNTER — Encounter (HOSPITAL_BASED_OUTPATIENT_CLINIC_OR_DEPARTMENT_OTHER): Payer: Self-pay | Admitting: Emergency Medicine

## 2022-12-28 ENCOUNTER — Emergency Department (HOSPITAL_BASED_OUTPATIENT_CLINIC_OR_DEPARTMENT_OTHER)
Admission: EM | Admit: 2022-12-28 | Discharge: 2022-12-28 | Disposition: A | Discharge status: Home / Self Care | Payer: PRIVATE HEALTH INSURANCE | Attending: Emergency Medicine | Admitting: Emergency Medicine

## 2022-12-28 ENCOUNTER — Other Ambulatory Visit: Payer: Self-pay

## 2022-12-28 DIAGNOSIS — H5712 Ocular pain, left eye: Secondary | ICD-10-CM | POA: Insufficient documentation

## 2022-12-28 MED ORDER — FLUORESCEIN SODIUM 1 MG OP STRP
1.0000 | ORAL_STRIP | Freq: Once | OPHTHALMIC | Status: AC
  Administered 2022-12-28: 1 via OPHTHALMIC
  Filled 2022-12-28: qty 1

## 2022-12-28 MED ORDER — TETRACAINE HCL 0.5 % OP SOLN
2.0000 [drp] | Freq: Once | OPHTHALMIC | Status: AC
  Administered 2022-12-28: 2 [drp] via OPHTHALMIC
  Filled 2022-12-28: qty 4

## 2022-12-28 MED ORDER — OFLOXACIN 0.3 % OP SOLN
1.0000 [drp] | Freq: Four times a day (QID) | OPHTHALMIC | Status: DC
  Administered 2022-12-28: 1 [drp] via OPHTHALMIC
  Filled 2022-12-28: qty 5

## 2022-12-28 NOTE — ED Triage Notes (Signed)
Patient arrived via POV c/o left eye pain x 1 hr pta. Patient states driving down road, felt something fly into her eye. Patient states flushing eye w/ no relief. Patient is AO x 4, VS WDL, normal gait.

## 2022-12-28 NOTE — ED Provider Notes (Signed)
[facility] Provider Note: Brittany Dell, MD, FACEP  CSN: [id number] MRN: [id number] ARRIVAL: 12/28/22 at 0049 ROOM: MH11/[facility]11   CHIEF COMPLAINT  Eye Pain   HISTORY OF PRESENT ILLNESS  12/28/22 3:18 AM Brittany Pierce is a 32 y.o. female who was driving about an hour prior to arrival and felt something fly into her left eye.  She is now having pain in her left eye despite flushing the eye with water.  She rates the pain as an 8 out of 10.  She denies blurred vision.   Past Medical History:  Diagnosis Date   Anemia    Depression    per prenatal   No pertinent past medical history     Past Surgical History:  Procedure Laterality Date   CESAREAN SECTION     HERNIA REPAIR      Family History  Problem Relation Age of Onset   Hypertension Mother    Asthma Father    Asthma Brother    Hypertension Maternal Grandmother    Glaucoma Maternal Grandfather    Anesthesia problems Neg Hx    Hypotension Neg Hx    Malignant hyperthermia Neg Hx    Pseudochol deficiency Neg Hx     Social History   Tobacco Use   Smoking status: Every Day    Packs/day: .25    Types: Cigarettes   Smokeless tobacco: Never  Vaping Use   Vaping Use: Never used  Substance Use Topics   Alcohol use: No   Drug use: Yes    Types: Marijuana    Comment: everyday 2 to 3 blunts per day.     Prior to Admission medications   Medication Sig Start Date End Date Taking? Authorizing Provider  methocarbamol (ROBAXIN) 500 MG tablet Take 1 tablet (500 mg total) by mouth 2 (two) times daily. 12/17/22   Prosperi, Christian H, PA-C  norethindrone (MICRONOR) 0.35 MG tablet Take 1 tablet (0.35 mg total) by mouth daily. 05/19/21   Allayne Stack, DO  predniSONE (DELTASONE) 10 MG tablet 6 tabs po day 1, 5 tabs po day 2, 4 tabs po day 3, 3 tabs po day 4, 2 tabs po day 5, 1 tab po day 6 12/21/22   Hudnall, Azucena Fallen, MD    Allergies Doxycycline and Baclofen   REVIEW OF SYSTEMS  Negative except as noted  here or in the History of Present Illness.   PHYSICAL EXAMINATION  Initial Vital Signs Blood pressure 122/75, pulse 74, temperature 98.4 F (36.9 C), temperature source Oral, resp. rate 17, height 5\' 4"  (1.626 m), weight 57.6 kg, last menstrual period 12/25/2022, SpO2 99 %.  Examination General: Well-developed, well-nourished female in no acute distress; appearance consistent with age of record HENT: normocephalic; atraumatic Eyes: pupils equal, round and reactive to light; extraocular muscles intact; excessive tearing of left eye without conjunctival injection; no hyphema; no fluorescein uptake; no foreign body seen in cornea or on surface of eye with eversion of the lids Neck: supple Heart: regular rate and rhythm Lungs: clear to auscultation bilaterally Abdomen: soft; nondistended; nontender; bowel sounds present Extremities: No deformity; full range of motion Neurologic: Awake, alert and oriented; motor function intact in all extremities and symmetric; no facial droop Skin: Warm and dry Psychiatric: Normal mood and affect   RESULTS  Summary of this visit's results, reviewed and interpreted by myself:   EKG Interpretation  Date/Time:    Ventricular Rate:    PR Interval:    QRS Duration:  QT Interval:    QTC Calculation:   R Axis:     Text Interpretation:         Laboratory Studies: No results found for this or any previous visit (from the past 24 hour(s)). Imaging Studies: No results found.  ED COURSE and MDM  Nursing notes, initial and subsequent vitals signs, including pulse oximetry, reviewed and interpreted by myself.  Vitals:   12/28/22 0050 12/28/22 0114  BP: 122/75   Pulse: 74   Resp: 17   Temp: 98.4 F (36.9 C)   TempSrc: Oral   SpO2: 99%   Weight:  57.6 kg  Height:  5\' 4"  (1.626 m)   Medications  ofloxacin (OCUFLOX) 0.3 % ophthalmic solution 1 drop (has no administration in time range)  tetracaine (PONTOCAINE) 0.5 % ophthalmic solution 2 drop  (2 drops Left Eye Given by Other 12/28/22 0255)  fluorescein ophthalmic strip 1 strip (1 strip Left Eye Given by Other 12/28/22 0255)   Because of the patient's pain was not seen on exam.  We will treat her with ofloxacin drops and have her follow-up with the ophthalmologist on-call later this morning.   PROCEDURES  Procedures   ED DIAGNOSES     ICD-10-CM   1. Left eye pain  H57.12          Teva Bronkema, Jonny Ruiz, MD 12/28/22 [phone]

## 2023-01-12 ENCOUNTER — Ambulatory Visit: Payer: PRIVATE HEALTH INSURANCE | Attending: Family Medicine | Admitting: Physical Therapy

## 2023-01-19 ENCOUNTER — Ambulatory Visit: Payer: PRIVATE HEALTH INSURANCE | Admitting: Physical Therapy

## 2023-01-24 ENCOUNTER — Ambulatory Visit: Payer: PRIVATE HEALTH INSURANCE | Admitting: Physical Therapy

## 2023-01-24 ENCOUNTER — Encounter: Payer: PRIVATE HEALTH INSURANCE | Admitting: Physical Therapy

## 2023-02-04 ENCOUNTER — Encounter: Payer: PRIVATE HEALTH INSURANCE | Admitting: Physical Therapy

## 2023-02-07 ENCOUNTER — Ambulatory Visit: Payer: PRIVATE HEALTH INSURANCE | Admitting: Family Medicine
